# Patient Record
Sex: Male | Born: 1960
Health system: Southern US, Community
[De-identification: ages and names within clinical notes are randomized; demographics above are authoritative.]

## PROBLEM LIST (undated history)

## (undated) DIAGNOSIS — M545 Low back pain, unspecified: Secondary | ICD-10-CM

## (undated) DIAGNOSIS — C44310 Basal cell carcinoma of skin of unspecified parts of face: Secondary | ICD-10-CM

## (undated) DIAGNOSIS — M5090 Cervical disc disorder, unspecified, unspecified cervical region: Secondary | ICD-10-CM

## (undated) DIAGNOSIS — R51 Headache: Secondary | ICD-10-CM

## (undated) DIAGNOSIS — F419 Anxiety disorder, unspecified: Secondary | ICD-10-CM

## (undated) DIAGNOSIS — Z9989 Dependence on other enabling machines and devices: Secondary | ICD-10-CM

## (undated) DIAGNOSIS — I493 Ventricular premature depolarization: Secondary | ICD-10-CM

## (undated) DIAGNOSIS — G43909 Migraine, unspecified, not intractable, without status migrainosus: Secondary | ICD-10-CM

## (undated) DIAGNOSIS — G4733 Obstructive sleep apnea (adult) (pediatric): Secondary | ICD-10-CM

## (undated) DIAGNOSIS — R519 Headache, unspecified: Secondary | ICD-10-CM

## (undated) DIAGNOSIS — T7840XA Allergy, unspecified, initial encounter: Secondary | ICD-10-CM

## (undated) DIAGNOSIS — I10 Essential (primary) hypertension: Secondary | ICD-10-CM

## (undated) HISTORY — PX: LAPAROSCOPIC CHOLECYSTECTOMY: SUR755

## (undated) HISTORY — PX: RHINOPLASTY: SUR1284

## (undated) HISTORY — DX: Essential (primary) hypertension: I10

## (undated) HISTORY — DX: Headache: R51

## (undated) HISTORY — PX: BASAL CELL CARCINOMA EXCISION: SHX1214

## (undated) HISTORY — DX: Headache, unspecified: R51.9

## (undated) HISTORY — DX: Ventricular premature depolarization: I49.3

## (undated) HISTORY — DX: Anxiety disorder, unspecified: F41.9

## (undated) HISTORY — DX: Low back pain, unspecified: M54.50

## (undated) HISTORY — PX: TONSILLECTOMY: SUR1361

## (undated) HISTORY — PX: VENTRICULAR ABLATION SURGERY: SHX835

## (undated) HISTORY — PX: COLONOSCOPY: SHX174

## (undated) HISTORY — DX: Allergy, unspecified, initial encounter: T78.40XA

---

## 1998-05-19 HISTORY — PX: UVULOPALATOPHARYNGOPLASTY: SHX827

## 1998-05-19 HISTORY — PX: SEPTOPLASTY: SUR1290

## 2014-06-08 ENCOUNTER — Encounter: Payer: Self-pay | Admitting: Internal Medicine

## 2014-08-09 ENCOUNTER — Ambulatory Visit (AMBULATORY_SURGERY_CENTER): Payer: Self-pay

## 2014-08-09 VITALS — Ht 72.0 in | Wt 206.8 lb

## 2014-08-09 DIAGNOSIS — Z800 Family history of malignant neoplasm of digestive organs: Secondary | ICD-10-CM

## 2014-08-09 MED ORDER — MOVIPREP 100 G PO SOLR: 1.0000 | Freq: Once | ORAL | Status: DC

## 2014-08-09 NOTE — Progress Notes (Signed)
No allergies to eggs or soy No diet/weight loss meds No home oxygen No past problems with anesthesia  Has email  Emmi instructions given for colonoscopy 

## 2014-08-10 ENCOUNTER — Telehealth: Payer: Self-pay | Admitting: Internal Medicine

## 2014-08-22 ENCOUNTER — Telehealth: Payer: Self-pay | Admitting: Internal Medicine

## 2014-08-22 NOTE — Telephone Encounter (Signed)
See note dated 08/22/2014

## 2014-08-22 NOTE — Telephone Encounter (Signed)
Spoke with patient and told him I would switch his prep from Moviprep to [employer] so that I could give him a free sample.  Patient's wife is going to call me to set up a time to come get the new instructions and the prep

## 2014-08-23 ENCOUNTER — Telehealth: Payer: Self-pay

## 2014-08-23 NOTE — Telephone Encounter (Signed)
Printed new suprep instructions.  Patient's wife will call to come get new instructions and Suprep sample

## 2014-08-30 ENCOUNTER — Encounter: Payer: Self-pay | Admitting: Internal Medicine

## 2014-08-30 ENCOUNTER — Ambulatory Visit (AMBULATORY_SURGERY_CENTER): Payer: 59 | Admitting: Internal Medicine

## 2014-08-30 VITALS — BP 106/65 | HR 73 | Resp 30

## 2014-08-30 DIAGNOSIS — Z1211 Encounter for screening for malignant neoplasm of colon: Secondary | ICD-10-CM

## 2014-08-30 DIAGNOSIS — D123 Benign neoplasm of transverse colon: Secondary | ICD-10-CM | POA: Diagnosis not present

## 2014-08-30 DIAGNOSIS — Z800 Family history of malignant neoplasm of digestive organs: Secondary | ICD-10-CM | POA: Diagnosis not present

## 2014-08-30 MED ORDER — SODIUM CHLORIDE 0.9 % IV SOLN: 500.0000 mL | INTRAVENOUS | Status: DC

## 2014-08-30 NOTE — Patient Instructions (Signed)
YOU HAD AN ENDOSCOPIC PROCEDURE TODAY AT [facility]:   Refer to the procedure report that was given to you for any specific questions about what was found during the examination.  If the procedure report does not answer your questions, please call your gastroenterologist to clarify.  If you requested that your care partner not be given the details of your procedure findings, then the procedure report has been included in a sealed envelope for you to review at your convenience later.  YOU SHOULD EXPECT: Some feelings of bloating in the abdomen. Passage of more gas than usual.  Walking can help get rid of the air that was put into your GI tract during the procedure and reduce the bloating. If you had a lower endoscopy (such as a colonoscopy or flexible sigmoidoscopy) you may notice spotting of blood in your stool or on the toilet paper. If you underwent a bowel prep for your procedure, you may not have a normal bowel movement for a few days.  Please Note:  You might notice some irritation and congestion in your nose or some drainage.  This is from the oxygen used during your procedure.  There is no need for concern and it should clear up in a day or so.  SYMPTOMS TO REPORT IMMEDIATELY:   Following lower endoscopy (colonoscopy or flexible sigmoidoscopy):  Excessive amounts of blood in the stool  Significant tenderness or worsening of abdominal pains  Swelling of the abdomen that is new, acute  Fever of 100F or higher   Following upper endoscopy (EGD)  Vomiting of blood or coffee ground material  New chest pain or pain under the shoulder blades  Painful or persistently difficult swallowing  New shortness of breath  Fever of 100F or higher  Black, tarry-looking stools  For urgent or emergent issues, a gastroenterologist can be reached at any hour by calling [phone].   DIET: Your first meal following the procedure should be a small meal and then it is ok to progress to  your normal diet. Heavy or fried foods are harder to digest and may make you feel nauseous or bloated.  Likewise, meals heavy in dairy and vegetables can increase bloating.  Drink plenty of fluids but you should avoid alcoholic beverages for 24 hours.  ACTIVITY:  You should plan to take it easy for the rest of today and you should NOT DRIVE or use heavy machinery until tomorrow (because of the sedation medicines used during the test).    FOLLOW UP: Our staff will call the number listed on your records the next business day following your procedure to check on you and address any questions or concerns that you may have regarding the information given to you following your procedure. If we do not reach you, we will leave a message.  However, if you are feeling well and you are not experiencing any problems, there is no need to return our call.  We will assume that you have returned to your regular daily activities without incident.  If any biopsies were taken you will be contacted by phone or by letter within the next 1-3 weeks.  Please call us at [phone] if you have not heard about the biopsies in 3 weeks.  Polyp information given.    SIGNATURES/CONFIDENTIALITY: You and/or your care partner have signed paperwork which will be entered into your electronic medical record.  These signatures attest to the fact that that the information above on your After Visit  Summary has been reviewed and is understood.  Full responsibility of the confidentiality of this discharge information lies with you and/or your care-partner.

## 2014-08-30 NOTE — Progress Notes (Signed)
Called to room to assist during endoscopic procedure.  Patient ID and intended procedure confirmed with present staff. Received instructions for my participation in the procedure from the performing physician.  

## 2014-08-30 NOTE — Op Note (Signed)
[place]  [address] [place], [address]   COLONOSCOPY PROCEDURE REPORT  PATIENT: Arthur Robinson, Arthur Robinson  MR#: [id number] BIRTHDATE: 05/04/1961 , 71  yrs. old GENDER: male ENDOSCOPIST: Eustace Quail, MD REFERRED BY:.Direct Self PROCEDURE DATE:  08/30/2014 PROCEDURE:   Colonoscopy, screening and Colonoscopy with snare polypectomy x 1 First Screening Colonoscopy - Avg.  risk and is 50 yrs.  old or older Yes.  Prior Negative Screening - Now for repeat screening. N/A  History of Adenoma - Now for follow-up colonoscopy & has been > or = to 3 yrs.  N/A ASA CLASS:   Class I INDICATIONS:Screening for colonic neoplasia and Colorectal Neoplasm Risk Assessment for this procedure is average risk.   Reports colonoscopy in [place] 15 yrs ago was unremarkable MEDICATIONS: Propofol 300 mg IV  DESCRIPTION OF PROCEDURE:   After the risks benefits and alternatives of the procedure were thoroughly explained, informed consent was obtained.  The digital rectal exam revealed no abnormalities of the rectum.   The LB [id number]  endoscope was introduced through the anus and advanced to the cecum, which was identified by both the appendix and ileocecal valve. No adverse events experienced.   The quality of the prep was excellent. (MoviPrep was used)  The instrument was then slowly withdrawn as the colon was fully examined.    COLON FINDINGS: Two polyps measuring 3 mm in size were found in the transverse colon.  A polypectomy was performed with a cold snare. The resection was complete, the polyp tissue was completely retrieved and sent to histology.   The examination was otherwise normal.  Retroflexed views revealed internal hemorrhoids. The time to cecum = 2.9 Withdrawal time = 13.1   The scope was withdrawn and the procedure completed.  COMPLICATIONS: There were no immediate complications.  ENDOSCOPIC IMPRESSION: 1.   Two polyps were found in the transverse colon;  polypectomy was performed with a cold snare 2.   The examination was otherwise normal  RECOMMENDATIONS: 1. Repeat colonoscopy in 5 years if polyp adenomatous; otherwise 10 years  eSigned:  Eustace Quail, MD 08/30/2014 11:03 AM   cc: The Patient

## 2014-08-30 NOTE — Progress Notes (Signed)
Patient awakening,vss,report to rn 

## 2014-08-31 ENCOUNTER — Telehealth: Payer: Self-pay | Admitting: *Deleted

## 2014-08-31 NOTE — Telephone Encounter (Signed)
  Follow up Call-  Call back number 08/30/2014  Post procedure Call Back phone  # [phone]  Permission to leave phone message Yes     No answer at # given.  Left message on VM.

## 2014-09-05 ENCOUNTER — Encounter: Payer: Self-pay | Admitting: Internal Medicine

## 2014-10-06 ENCOUNTER — Encounter: Payer: Self-pay | Admitting: Internal Medicine

## 2014-10-06 NOTE — Progress Notes (Signed)
We requested all GI records from Dr Francine Graven [facility]--- Fax [phone]) on patient. We have received correspondence from Mallie Mussel at [facility] that there are no medical records for the patient at their facility.

## 2014-11-17 ENCOUNTER — Encounter: Payer: Self-pay | Admitting: Internal Medicine

## 2015-03-23 ENCOUNTER — Encounter: Payer: Self-pay | Admitting: Internal Medicine

## 2015-04-25 ENCOUNTER — Encounter: Payer: Self-pay | Admitting: Internal Medicine

## 2015-05-20 HISTORY — PX: INGUINAL HERNIA REPAIR: SUR1180

## 2015-06-18 DIAGNOSIS — I493 Ventricular premature depolarization: Secondary | ICD-10-CM | POA: Diagnosis not present

## 2015-07-11 DIAGNOSIS — G4733 Obstructive sleep apnea (adult) (pediatric): Secondary | ICD-10-CM | POA: Diagnosis not present

## 2015-07-18 ENCOUNTER — Other Ambulatory Visit: Payer: Self-pay

## 2015-07-18 NOTE — Patient Outreach (Signed)
12:10pm  High Risk Screening:  Placed call to patient who reports he is at work and can not talk.  Asked for a call back later.  PLAN: Will attempt outreach again.  Tomasa Rand, RN, BSN, CEN [facility] [employer] [phone]

## 2015-07-26 ENCOUNTER — Other Ambulatory Visit: Payer: Self-pay

## 2015-07-26 NOTE — Patient Outreach (Signed)
UMR High risk screening: 2nd call attempt to reach patient. No answer. Left a message requesting a call back.  PLAN: Will attempt another outreach in 1 week. If no response, will mail letter.   Tomasa Rand, RN, BSN, CEN [facility] [employer] [phone]

## 2015-07-30 ENCOUNTER — Other Ambulatory Visit: Payer: Self-pay

## 2015-07-30 NOTE — Patient Outreach (Signed)
UMR high risk screening: Received call back from patient.  Patient reports that he is doing well. Reports that he had to have a ablation and it was unsuccessful. Reports that he will likely need another ablation in the next few months.    Patient reports that he is able to get his medications at a local pharmacy and it is cheaper than mail order. Pays out of pocket for this medication:  Atenolol.  Patient reports his only concern is out being out of network for any procedure not done at a [place] facility. Patient reports that he is already established with and Electrophysiologist in [place].  He is interested in an exception for his health insurance to cover procedure and upcoming procedures. Consent: Patient consent for [facility] to speak with wife Melville Lionetti PLAN: Reminded patient of benefit of [place].            Will forward request to assistant director Bary Castilla for assistance with insurance exception.             Otherwise denied any other needs or concerns.  Tomasa Rand, RN, BSN, CEN [facility] [employer] [phone]

## 2015-07-30 NOTE — Patient Outreach (Signed)
High risk UMR screening:  3rd attempt to reach patient for High Cost UMR screening.  No answer.  Plan: Will mail letter to patient regarding outreach attempt.  Tomasa Rand, RN, BSN, CEN [facility] [employer] [phone]

## 2015-09-03 ENCOUNTER — Institutional Professional Consult (permissible substitution): Payer: 59 | Admitting: Internal Medicine

## 2015-09-06 DIAGNOSIS — I10 Essential (primary) hypertension: Secondary | ICD-10-CM | POA: Diagnosis not present

## 2015-09-06 DIAGNOSIS — I493 Ventricular premature depolarization: Secondary | ICD-10-CM | POA: Diagnosis not present

## 2015-09-06 DIAGNOSIS — Z79899 Other long term (current) drug therapy: Secondary | ICD-10-CM | POA: Diagnosis not present

## 2015-10-03 DIAGNOSIS — F419 Anxiety disorder, unspecified: Secondary | ICD-10-CM | POA: Insufficient documentation

## 2015-10-03 DIAGNOSIS — Z8379 Family history of other diseases of the digestive system: Secondary | ICD-10-CM | POA: Insufficient documentation

## 2015-10-03 DIAGNOSIS — G4733 Obstructive sleep apnea (adult) (pediatric): Secondary | ICD-10-CM | POA: Insufficient documentation

## 2015-10-03 DIAGNOSIS — I493 Ventricular premature depolarization: Secondary | ICD-10-CM | POA: Insufficient documentation

## 2015-10-08 ENCOUNTER — Other Ambulatory Visit: Payer: Self-pay

## 2015-10-08 ENCOUNTER — Ambulatory Visit (INDEPENDENT_AMBULATORY_CARE_PROVIDER_SITE_OTHER): Payer: 59 | Admitting: Internal Medicine

## 2015-10-08 ENCOUNTER — Encounter: Payer: Self-pay | Admitting: Internal Medicine

## 2015-10-08 VITALS — BP 142/80 | HR 79 | Ht 72.0 in | Wt 207.6 lb

## 2015-10-08 DIAGNOSIS — I493 Ventricular premature depolarization: Secondary | ICD-10-CM | POA: Diagnosis not present

## 2015-10-08 DIAGNOSIS — G4733 Obstructive sleep apnea (adult) (pediatric): Secondary | ICD-10-CM

## 2015-10-08 LAB — BASIC METABOLIC PANEL
BUN: 10 mg/dL (ref 7–25)
CHLORIDE: 104 mmol/L (ref 98–110)
CO2: 29 mmol/L (ref 20–31)
CREATININE: 0.79 mg/dL (ref 0.70–1.33)
Calcium: 9.1 mg/dL (ref 8.6–10.3)
Glucose, Bld: 85 mg/dL (ref 65–99)
Potassium: 4.2 mmol/L (ref 3.5–5.3)
Sodium: 141 mmol/L (ref 135–146)

## 2015-10-08 LAB — CBC WITH DIFFERENTIAL/PLATELET
BASOS ABS: 0 {cells}/uL (ref 0–200)
Basophils Relative: 0 %
EOS ABS: 392 {cells}/uL (ref 15–500)
Eosinophils Relative: 7 %
HEMATOCRIT: 43.7 % (ref 38.5–50.0)
HEMOGLOBIN: 14.8 g/dL (ref 13.2–17.1)
LYMPHS ABS: 1400 {cells}/uL (ref 850–3900)
Lymphocytes Relative: 25 %
MCH: 30.6 pg (ref 27.0–33.0)
MCHC: 33.9 g/dL (ref 32.0–36.0)
MCV: 90.5 fL (ref 80.0–100.0)
MONO ABS: 504 {cells}/uL (ref 200–950)
MONOS PCT: 9 %
MPV: 10.1 fL (ref 7.5–12.5)
NEUTROS ABS: 3304 {cells}/uL (ref 1500–7800)
Neutrophils Relative %: 59 %
PLATELETS: 232 10*3/uL (ref 140–400)
RBC: 4.83 MIL/uL (ref 4.20–5.80)
RDW: 13.5 % (ref 11.0–15.0)
WBC: 5.6 10*3/uL (ref 3.8–10.8)

## 2015-10-08 NOTE — Progress Notes (Signed)
Electrophysiology Office Note   Date:  10/10/2015   ID:  Arthur Robinson, DOB [birth year], MRN [id number]  PCP:  Deucher, Herbie [place] ([place])  Cardiologist:  none Primary Electrophysiologist: Aggie Cosier MD  CC: PVCs   History of Present Illness: Arthur Robinson is a 55 y.o. male who presents today for electrophysiology evaluation.   The patient reports initially having PVCs of abrupt onset 4/16.  He has had difficulty with PVCs since that time.  He reports symptoms of palpitations, neck fullness, and decreased exercise tolerance with his PVCs. He continues to play tennis and finds that PVCs are worse with exercise.  He was evaluated by Dr Uvaldo Rising.  He tried metoprolol without improvement.  He underwent PVC ablation 03/27/15.  This was unsuccessful with extensive mapping and ablation on the RVOT and LVOT sites.  He returned for second ablation 09/06/15.  He reports that he did well initiation post ablation but has since had return of his PVCs.  He presents today for second opinion.  He previously tried atenolol and had postural dizziness/ presyncope.  Today, he denies symptoms of chest pain, shortness of breath, orthopnea, PND, lower extremity edema, claudication, dizziness, presyncope, syncope, bleeding, or neurologic sequela. The patient is tolerating medications without difficulties and is otherwise without complaint today.    Past Medical History  Diagnosis Date  . Headache   . Hypertension     has not required medicine (typically 130/90)  . Obstructive sleep apnea     uses CPAP  . Anxiety   . PVC (premature ventricular contraction)     s/p ablation x 2 in [place]   Past Surgical History  Procedure Laterality Date  . Cholecystectomy    . Rhinoplasty    . Uvulopalatopharyngoplasty       Current Outpatient Prescriptions  Medication Sig Dispense Refill  . ibuprofen (ADVIL,MOTRIN) 200 MG tablet Take 200 mg by mouth every 6 (six) hours as needed. Takes 200-400 1-4 times per  month headache     No current facility-administered medications for this visit.    Allergies:   Review of patient's allergies indicates no known allergies.   Social History:  The patient  reports that he has never smoked. He has never used smokeless tobacco. He reports that he drinks about 2.4 oz of alcohol per week. He reports that he does not use illicit drugs.   Family History:  The patient's  family history includes Atrial fibrillation in his sister; Colon cancer in his maternal grandmother; Crohn's disease in his brother; Heart disease in his father and maternal grandfather; Hypertension in his father and mother; Lung cancer in his mother; Sudden death in his maternal grandfather.    ROS:  Please see the history of present illness.   All other systems are reviewed and negative.    PHYSICAL EXAM: VS:  BP 142/80 mmHg  Pulse 79  Ht 6' (1.829 m)  Wt 207 lb 9.6 oz (94.167 kg)  BMI 28.15 kg/m2 , BMI Body mass index is 28.15 kg/(m^2). GEN: Well nourished, well developed, in no acute distress HEENT: normal Neck: no JVD, carotid bruits, or masses Cardiac: RRR with frequent ectopy; no murmurs, rubs, or gallops,no edema  Respiratory:  clear to auscultation bilaterally, normal work of breathing GI: soft, nontender, nondistended, + BS MS: no deformity or atrophy Skin: warm and dry  Neuro:  Strength and sensation are intact Psych: euthymic mood, full affect  EKG:  EKG is ordered today. The ekg ordered today shows sinus rhythm 79  bpm, PVCs (LBB inferior axis with transition V2-V3)   Wt Readings from Last 3 Encounters:  10/08/15 207 lb 9.6 oz (94.167 kg)  08/09/14 206 lb 12.8 oz (93.804 kg)      Other studies Reviewed: Additional studies/ records that were reviewed today include: 2 prior ep studies, event monitor and notes from [place]  Review of the above records today demonstrates: as above   ASSESSMENT AND PLAN:  1.  PVCs The patient has symptomatic outflow tract PVCs  despite abaltion x 2. Therapeutic strategies for PVCs including medicine and ablation were discussed in detail with the patient today. Risk, benefits, and alternatives to EP study and radiofrequency ablation were also discussed in detail today. These risks include but are not limited to stroke, bleeding, vascular damage, tamponade, perforation, damage to the heart and other structures, AV block requiring pacemaker, worsening renal function, and death. The patient understands these risk and wishes to proceed.  We will therefore proceed with catheter ablation at the next available time.  2. HTN Stable No change required today  3. OSA Stable No change required today  Current medicines are reviewed at length with the patient today.   The patient does not have concerns regarding his medicines.  The following changes were made today:  none  Labs/ tests ordered today include:  Orders Placed This Encounter  Procedures  . Basic metabolic panel  . CBC with Differential  . EKG 12-Lead     Signed, Thompson Grayer, MD  10/10/2015 8:58 AM     [facility] [address] [place] [address] [phone] (office) [phone] (fax)

## 2015-10-08 NOTE — Patient Instructions (Signed)
Medication Instructions:  Your physician recommends that you continue on your current medications as directed. Please refer to the Current Medication list given to you today.   Labwork: Your physician recommends that you return for lab work today: BMP/CBC   Testing/Procedures: Your physician has recommended that you have an ablation. Catheter ablation is a medical procedure used to treat some cardiac arrhythmias (irregular heartbeats). During catheter ablation, a long, thin, flexible tube is put into a blood vessel in your groin (upper thigh), or neck. This tube is called an ablation catheter. It is then guided to your heart through the blood vessel. Radio frequency waves destroy small areas of heart tissue where abnormal heartbeats may cause an arrhythmia to start. Please see the instruction sheet given to you today.  Please check in at the [facility] at 8:30am on 10/16/15 Do not eat or drink after midnight the night before your procedure Do not take any medications the morning of your procedure  Follow-Up: Your physician recommends that you schedule a follow-up appointment in: 4 weeks from 10/16/15 with Dr Rayann Heman    Any Other Special Instructions Will Be Listed Below (If Applicable).     If you need a refill on your cardiac medications before your next appointment, please call your pharmacy.

## 2015-10-16 ENCOUNTER — Ambulatory Visit (HOSPITAL_COMMUNITY): Payer: 59 | Admitting: Anesthesiology

## 2015-10-16 ENCOUNTER — Encounter (HOSPITAL_COMMUNITY)
Admission: RE | Disposition: A | Discharge status: Home / Self Care | Payer: Self-pay | Source: Ambulatory Visit | Attending: Internal Medicine

## 2015-10-16 ENCOUNTER — Ambulatory Visit (HOSPITAL_COMMUNITY)
Admission: RE | Admit: 2015-10-16 | Discharge: 2015-10-16 | Disposition: A | Discharge status: Home / Self Care | Payer: 59 | Source: Ambulatory Visit | Attending: Internal Medicine | Admitting: Internal Medicine

## 2015-10-16 ENCOUNTER — Encounter (HOSPITAL_COMMUNITY): Payer: Self-pay | Admitting: Anesthesiology

## 2015-10-16 DIAGNOSIS — Z8249 Family history of ischemic heart disease and other diseases of the circulatory system: Secondary | ICD-10-CM | POA: Insufficient documentation

## 2015-10-16 DIAGNOSIS — I472 Ventricular tachycardia: Secondary | ICD-10-CM | POA: Diagnosis not present

## 2015-10-16 DIAGNOSIS — I493 Ventricular premature depolarization: Secondary | ICD-10-CM | POA: Diagnosis not present

## 2015-10-16 DIAGNOSIS — G4733 Obstructive sleep apnea (adult) (pediatric): Secondary | ICD-10-CM | POA: Insufficient documentation

## 2015-10-16 DIAGNOSIS — I10 Essential (primary) hypertension: Secondary | ICD-10-CM | POA: Diagnosis not present

## 2015-10-16 HISTORY — PX: ELECTROPHYSIOLOGIC STUDY: SHX172A

## 2015-10-16 HISTORY — DX: Basal cell carcinoma of skin of unspecified parts of face: C44.310

## 2015-10-16 HISTORY — DX: Migraine, unspecified, not intractable, without status migrainosus: G43.909

## 2015-10-16 HISTORY — DX: Dependence on other enabling machines and devices: Z99.89

## 2015-10-16 HISTORY — DX: Cervical disc disorder, unspecified, unspecified cervical region: M50.90

## 2015-10-16 HISTORY — DX: Obstructive sleep apnea (adult) (pediatric): G47.33

## 2015-10-16 SURGERY — A-FLUTTER/A-TACH/SVT ABLATION
Anesthesia: Monitor Anesthesia Care

## 2015-10-16 MED ORDER — MIDAZOLAM HCL 5 MG/5ML IJ SOLN
INTRAMUSCULAR | Status: DC | PRN
  Administered 2015-10-16: 2 mg via INTRAVENOUS

## 2015-10-16 MED ORDER — ONDANSETRON HCL 4 MG/2ML IJ SOLN: 4.0000 mg | Freq: Four times a day (QID) | INTRAMUSCULAR | Status: DC | PRN

## 2015-10-16 MED ORDER — ACETAMINOPHEN 325 MG PO TABS: 650.0000 mg | ORAL_TABLET | ORAL | Status: DC | PRN

## 2015-10-16 MED ORDER — OXYCODONE HCL 5 MG PO TABS: 5.0000 mg | ORAL_TABLET | Freq: Once | ORAL | Status: DC | PRN

## 2015-10-16 MED ORDER — SODIUM CHLORIDE 0.9 % IV SOLN
2.0000 ug/min | INTRAVENOUS | Status: AC
Start: 2015-10-16 — End: 2015-10-16
  Administered 2015-10-16: 2 ug/min via INTRAVENOUS
  Filled 2015-10-16: qty 2

## 2015-10-16 MED ORDER — SODIUM CHLORIDE 0.9% FLUSH: 3.0000 mL | INTRAVENOUS | Status: DC | PRN

## 2015-10-16 MED ORDER — BUPIVACAINE HCL (PF) 0.25 % IJ SOLN
INTRAMUSCULAR | Status: DC | PRN
  Administered 2015-10-16: 30 mL

## 2015-10-16 MED ORDER — BUPIVACAINE HCL (PF) 0.25 % IJ SOLN
INTRAMUSCULAR | Status: AC
  Filled 2015-10-16: qty 30

## 2015-10-16 MED ORDER — PROPOFOL 500 MG/50ML IV EMUL
INTRAVENOUS | Status: DC | PRN
  Administered 2015-10-16: 25 ug/kg/min via INTRAVENOUS

## 2015-10-16 MED ORDER — OXYCODONE HCL 5 MG/5ML PO SOLN: 5.0000 mg | Freq: Once | ORAL | Status: DC | PRN

## 2015-10-16 MED ORDER — SODIUM CHLORIDE 0.9 % IV SOLN
INTRAVENOUS | Status: DC
  Administered 2015-10-16 (×2): via INTRAVENOUS

## 2015-10-16 MED ORDER — HYDROCODONE-ACETAMINOPHEN 5-325 MG PO TABS: 1.0000 | ORAL_TABLET | ORAL | Status: DC | PRN

## 2015-10-16 MED ORDER — ONDANSETRON HCL 4 MG/2ML IJ SOLN: 4.0000 mg | Freq: Once | INTRAMUSCULAR | Status: DC | PRN

## 2015-10-16 MED ORDER — SODIUM CHLORIDE 0.9 % IV SOLN: 250.0000 mL | INTRAVENOUS | Status: DC | PRN

## 2015-10-16 MED ORDER — SODIUM CHLORIDE 0.9% FLUSH
3.0000 mL | Freq: Two times a day (BID) | INTRAVENOUS | Status: DC
  Administered 2015-10-16: 3 mL via INTRAVENOUS

## 2015-10-16 MED ORDER — FENTANYL CITRATE (PF) 100 MCG/2ML IJ SOLN: 25.0000 ug | INTRAMUSCULAR | Status: DC | PRN

## 2015-10-16 SURGICAL SUPPLY — 11 items
BAG SNAP BAND KOVER 36X36 (MISCELLANEOUS) ×3 IMPLANT
CATH DECANAV D CURVE (CATHETERS) ×3 IMPLANT
CATH EZ STEER NAV 4MM D-F CUR (ABLATOR) ×3 IMPLANT
CATH JOSEPHSON QUAD-ALLRED 6FR (CATHETERS) ×3 IMPLANT
PACK EP LATEX FREE (CUSTOM PROCEDURE TRAY) ×2
PACK EP LF (CUSTOM PROCEDURE TRAY) ×1 IMPLANT
PAD DEFIB LIFELINK (PAD) ×3 IMPLANT
PATCH CARTO3 (PAD) ×3 IMPLANT
SHEATH PINNACLE 6F 10CM (SHEATH) ×3 IMPLANT
SHEATH PINNACLE 7F 10CM (SHEATH) ×3 IMPLANT
SHEATH PINNACLE 8F 10CM (SHEATH) ×3 IMPLANT

## 2015-10-16 NOTE — Progress Notes (Signed)
Site area: Right groin a 6, 7,8 french venous sheath was removed  Site Prior to Removal:  Level 0  Pressure Applied For 30 MINUTES    Bedrest Beginning at 1455p  Manual:   Yes.    Patient Status During Pull:  stable  Post Pull Groin Site:  Level 0  Post Pull Instructions Given:  Yes.    Post Pull Pulses Present:  Yes.    Dressing Applied:  Yes.    Comments:  VS remain stable during sheath pull.

## 2015-10-16 NOTE — Progress Notes (Signed)
Discharge home,discharged instruction given to the patient. Danielle Lento RN

## 2015-10-16 NOTE — Progress Notes (Signed)
Doing well post ablation Will plan to discharge home once bedrest up Routine follow up and instructions  Chanetta Marshall, NP 10/16/2015 3:49 PM  Trude Mcburney

## 2015-10-16 NOTE — Discharge Instructions (Signed)
No driving for 3 days. No lifting over 5 lbs for 1 week. No sexual activity for 1 week. You may return to work in 1 week.  Keep procedure site clean & dry. If you notice increased pain, swelling, bleeding or pus, call/return!  You may shower, but no soaking baths/hot tubs/pools for 1 week.  ° ° °

## 2015-10-16 NOTE — Transfer of Care (Signed)
Immediate Anesthesia Transfer of Care Note  Patient: Arthur Robinson  Procedure(s) Performed: Procedure(s): PVC Ablation (N/A)  Patient Location: Cath Lab  Anesthesia Type:MAC  Level of Consciousness: awake, alert , oriented and patient cooperative  Airway & Oxygen Therapy: Patient Spontanous Breathing  Post-op Assessment: Report given to RN and Post -op Vital signs reviewed and stable  Post vital signs: Reviewed and stable  Last Vitals:  Filed Vitals:   10/16/15 0820  BP: 157/75  Pulse: 44  Temp: 36.5 C  Resp: 16    Last Pain:  Filed Vitals:   10/16/15 0845  PainSc: 2          Complications: No apparent anesthesia complications

## 2015-10-16 NOTE — Anesthesia Postprocedure Evaluation (Signed)
Anesthesia Post Note  Patient: Arthur Robinson  Procedure(s) Performed: Procedure(s) (LRB): PVC Ablation (N/A)  Patient location during evaluation: Cath Lab Anesthesia Type: General Level of consciousness: awake, awake and alert and oriented Pain management: pain level controlled Vital Signs Assessment: post-procedure vital signs reviewed and stable Respiratory status: spontaneous breathing, nonlabored ventilation and respiratory function stable Cardiovascular status: blood pressure returned to baseline Anesthetic complications: no    Last Vitals:  Filed Vitals:   10/16/15 1450 10/16/15 1455  BP: 140/87 132/84  Pulse: 72 78  Temp:    Resp: 14 12    Last Pain:  Filed Vitals:   10/16/15 1459  PainSc: 2                  Lilas Diefendorf COKER

## 2015-10-16 NOTE — H&P (View-Only) (Signed)
Electrophysiology Office Note   Date:  10/10/2015   ID:  Arthur Robinson, DOB 12-Feb-1961, MRN [id number]  PCP:  Deucher, Herbie [place] ([place])  Cardiologist:  none Primary Electrophysiologist: Aggie Cosier MD  CC: PVCs   History of Present Illness: Arthur Robinson is a 55 y.o. male who presents today for electrophysiology evaluation.   The patient reports initially having PVCs of abrupt onset 4/16.  He has had difficulty with PVCs since that time.  He reports symptoms of palpitations, neck fullness, and decreased exercise tolerance with his PVCs. He continues to play tennis and finds that PVCs are worse with exercise.  He was evaluated by Dr Uvaldo Rising.  He tried metoprolol without improvement.  He underwent PVC ablation 03/27/15.  This was unsuccessful with extensive mapping and ablation on the RVOT and LVOT sites.  He returned for second ablation 09/06/15.  He reports that he did well initiation post ablation but has since had return of his PVCs.  He presents today for second opinion.  He previously tried atenolol and had postural dizziness/ presyncope.  Today, he denies symptoms of chest pain, shortness of breath, orthopnea, PND, lower extremity edema, claudication, dizziness, presyncope, syncope, bleeding, or neurologic sequela. The patient is tolerating medications without difficulties and is otherwise without complaint today.    Past Medical History  Diagnosis Date  . Headache   . Hypertension     has not required medicine (typically 130/90)  . Obstructive sleep apnea     uses CPAP  . Anxiety   . PVC (premature ventricular contraction)     s/p ablation x 2 in [place]   Past Surgical History  Procedure Laterality Date  . Cholecystectomy    . Rhinoplasty    . Uvulopalatopharyngoplasty       Current Outpatient Prescriptions  Medication Sig Dispense Refill  . ibuprofen (ADVIL,MOTRIN) 200 MG tablet Take 200 mg by mouth every 6 (six) hours as needed. Takes 200-400 1-4 times per  month headache     No current facility-administered medications for this visit.    Allergies:   Review of patient's allergies indicates no known allergies.   Social History:  The patient  reports that he has never smoked. He has never used smokeless tobacco. He reports that he drinks about 2.4 oz of alcohol per week. He reports that he does not use illicit drugs.   Family History:  The patient's  family history includes Atrial fibrillation in his sister; Colon cancer in his maternal grandmother; Crohn's disease in his brother; Heart disease in his father and maternal grandfather; Hypertension in his father and mother; Lung cancer in his mother; Sudden death in his maternal grandfather.    ROS:  Please see the history of present illness.   All other systems are reviewed and negative.    PHYSICAL EXAM: VS:  BP 142/80 mmHg  Pulse 79  Ht 6' (1.829 m)  Wt 207 lb 9.6 oz (94.167 kg)  BMI 28.15 kg/m2 , BMI Body mass index is 28.15 kg/(m^2). GEN: Well nourished, well developed, in no acute distress HEENT: normal Neck: no JVD, carotid bruits, or masses Cardiac: RRR with frequent ectopy; no murmurs, rubs, or gallops,no edema  Respiratory:  clear to auscultation bilaterally, normal work of breathing GI: soft, nontender, nondistended, + BS MS: no deformity or atrophy Skin: warm and dry  Neuro:  Strength and sensation are intact Psych: euthymic mood, full affect  EKG:  EKG is ordered today. The ekg ordered today shows sinus rhythm 79  bpm, PVCs (LBB inferior axis with transition V2-V3)   Wt Readings from Last 3 Encounters:  10/08/15 207 lb 9.6 oz (94.167 kg)  08/09/14 206 lb 12.8 oz (93.804 kg)      Other studies Reviewed: Additional studies/ records that were reviewed today include: 2 prior ep studies, event monitor and notes from [place]  Review of the above records today demonstrates: as above   ASSESSMENT AND PLAN:  1.  PVCs The patient has symptomatic outflow tract PVCs  despite abaltion x 2. Therapeutic strategies for PVCs including medicine and ablation were discussed in detail with the patient today. Risk, benefits, and alternatives to EP study and radiofrequency ablation were also discussed in detail today. These risks include but are not limited to stroke, bleeding, vascular damage, tamponade, perforation, damage to the heart and other structures, AV block requiring pacemaker, worsening renal function, and death. The patient understands these risk and wishes to proceed.  We will therefore proceed with catheter ablation at the next available time.  2. HTN Stable No change required today  3. OSA Stable No change required today  Current medicines are reviewed at length with the patient today.   The patient does not have concerns regarding his medicines.  The following changes were made today:  none  Labs/ tests ordered today include:  Orders Placed This Encounter  Procedures  . Basic metabolic panel  . CBC with Differential  . EKG 12-Lead     Signed, Thompson Grayer, MD  10/10/2015 8:58 AM     [facility] [address] [place] [address] [phone] (office) [phone] (fax)

## 2015-10-16 NOTE — Anesthesia Preprocedure Evaluation (Addendum)
Anesthesia Evaluation  Patient identified by MRN, date of birth, ID band Patient awake    Reviewed: Allergy & Precautions, NPO status , Patient's Chart, lab work & pertinent test results  Airway Mallampati: II  TM Distance: >3 FB Neck ROM: Full    Dental  (+) Teeth Intact, Dental Advisory Given   Pulmonary    breath sounds clear to auscultation       Cardiovascular hypertension,  Rhythm:Regular Rate:Normal     Neuro/Psych    GI/Hepatic   Endo/Other    Renal/GU      Musculoskeletal   Abdominal   Peds  Hematology   Anesthesia Other Findings   Reproductive/Obstetrics                            Anesthesia Physical Anesthesia Plan  ASA: III  Anesthesia Plan: MAC   Post-op Pain Management:    Induction: Intravenous  Airway Management Planned: Natural Airway and Simple Face Mask  Additional Equipment:   Intra-op Plan:   Post-operative Plan:   Informed Consent: I have reviewed the patients History and Physical, chart, labs and discussed the procedure including the risks, benefits and alternatives for the proposed anesthesia with the patient or authorized representative who has indicated his/her understanding and acceptance.   Dental advisory given  Plan Discussed with: CRNA and Anesthesiologist  Anesthesia Plan Comments:        Anesthesia Quick Evaluation

## 2015-10-16 NOTE — Interval H&P Note (Signed)
History and Physical Interval Note:  10/16/2015 11:41 AM  Arthur Robinson  has presented today for surgery, with the diagnosis of pvc  The various methods of treatment have been discussed with the patient and family. After consideration of risks, benefits and other options for treatment, the patient has consented to  Procedure(s): PVC Ablation (N/A) as a surgical intervention .  The patient's history has been reviewed, patient examined, no change in status, stable for surgery.  I have reviewed the patient's chart and labs.  Questions were answered to the patient's satisfaction.     Thompson Grayer

## 2015-10-17 ENCOUNTER — Encounter (HOSPITAL_COMMUNITY): Payer: Self-pay | Admitting: Internal Medicine

## 2015-11-14 ENCOUNTER — Ambulatory Visit (INDEPENDENT_AMBULATORY_CARE_PROVIDER_SITE_OTHER): Payer: 59 | Admitting: Internal Medicine

## 2015-11-14 ENCOUNTER — Encounter: Payer: Self-pay | Admitting: Internal Medicine

## 2015-11-14 VITALS — BP 136/88 | HR 73 | Ht 72.0 in | Wt 210.0 lb

## 2015-11-14 DIAGNOSIS — I493 Ventricular premature depolarization: Secondary | ICD-10-CM | POA: Diagnosis not present

## 2015-11-14 NOTE — Patient Instructions (Addendum)
Medication Instructions:  Your physician recommends that you continue on your current medications as directed. Please refer to the Current Medication list given to you today.  Labwork: None ordered.  Testing/Procedures: None ordered.  Follow-Up: Your physician recommends that you schedule a follow-up appointment as needed.   Any Other Special Instructions Will Be Listed Below (If Applicable).     If you need a refill on your cardiac medications before your next appointment, please call your pharmacy.   

## 2015-11-14 NOTE — Progress Notes (Signed)
   Arthur Robinson is a 55 y.o. male who presents today for routine electrophysiology followup.  Since his recent PVC ablation, the patient reports doing very well.  He denies procedure related complications and is pleased with his results.  Very rare PVCs now.   Today, he denies symptoms of chest pain, shortness of breath,  lower extremity edema, dizziness, presyncope, or syncope.  The patient is otherwise without complaint today.   Past Medical History  Diagnosis Date  . PVC (premature ventricular contraction)     s/p ablation x 2 in [place], third ablation at [facility] by Dr Rayann Heman 09/2015  . Basal cell carcinoma, face     "several frozen off; one cut off" (10/16/2015)  . Hypertension     has not required medicine (typically 130/90)  . OSA on CPAP   . Migraine     hx  . Headache     "~ monthly; related to cervical disc disease" (10/16/2015)  . Cervical disc disease   . Anxiety     "use Xanax prn" (10/16/2015)   Past Surgical History  Procedure Laterality Date  . Rhinoplasty    . Uvulopalatopharyngoplasty  2000  . Tonsillectomy  ~ 1975  . Laparoscopic cholecystectomy  2000s  . Basal cell carcinoma excision  ~ 2010    "face"  . Ventricular ablation surgery  03/27/2015; 09/06/2015; 10/16/2015  . Septoplasty  2000    w/UPPP  . Electrophysiologic study N/A 10/16/2015    Procedure: PVC Ablation;  Surgeon: Thompson Grayer, MD;  Location: [facility];  Service: Cardiovascular;  Laterality: N/A;    ROS- all systems are reviewed and negatives except as per HPI above  Current Outpatient Prescriptions  Medication Sig Dispense Refill  . hydrocortisone cream 1 % Apply 1 application topically daily as needed (For dry skin.).    Marland Kitchen ibuprofen (ADVIL,MOTRIN) 200 MG tablet Take 200 mg by mouth every 6 (six) hours as needed for headache.     Marland Kitchen MAGNESIUM PO Take 400-500 mg by mouth daily as needed (Palpitations).     No current facility-administered medications for this visit.    Physical  Exam: Filed Vitals:   11/14/15 1555  BP: 136/88  Pulse: 73  Height: 6' (1.829 m)  Weight: 210 lb (95.255 kg)    GEN- The patient is well appearing, alert and oriented x 3 today.   Head- normocephalic, atraumatic Eyes-  Sclera clear, conjunctiva pink Ears- hearing intact Oropharynx- clear Lungs- Clear to ausculation bilaterally, normal work of breathing Heart- Regular rate and rhythm, no murmurs, rubs or gallops, PMI not laterally displaced GI- soft, NT, ND, + BS Extremities- no clubbing, cyanosis, or edema  ekg today reveals sinus rhythm 73 bpm, normal ekg  Assessment and Plan:  1. PVCs Well controlled post ablation No further workup planned  Return as needed  Thompson Grayer MD, [facility] 11/14/2015 5:39 PM

## 2015-11-15 DIAGNOSIS — D2372 Other benign neoplasm of skin of left lower limb, including hip: Secondary | ICD-10-CM | POA: Diagnosis not present

## 2015-11-15 DIAGNOSIS — L718 Other rosacea: Secondary | ICD-10-CM | POA: Diagnosis not present

## 2015-11-15 DIAGNOSIS — D1801 Hemangioma of skin and subcutaneous tissue: Secondary | ICD-10-CM | POA: Diagnosis not present

## 2015-11-15 DIAGNOSIS — Z85828 Personal history of other malignant neoplasm of skin: Secondary | ICD-10-CM | POA: Diagnosis not present

## 2015-11-15 DIAGNOSIS — L812 Freckles: Secondary | ICD-10-CM | POA: Diagnosis not present

## 2015-11-15 DIAGNOSIS — L2089 Other atopic dermatitis: Secondary | ICD-10-CM | POA: Diagnosis not present

## 2015-11-15 DIAGNOSIS — L218 Other seborrheic dermatitis: Secondary | ICD-10-CM | POA: Diagnosis not present

## 2015-11-15 DIAGNOSIS — D225 Melanocytic nevi of trunk: Secondary | ICD-10-CM | POA: Diagnosis not present

## 2015-11-15 DIAGNOSIS — L821 Other seborrheic keratosis: Secondary | ICD-10-CM | POA: Diagnosis not present

## 2015-12-12 DIAGNOSIS — I493 Ventricular premature depolarization: Secondary | ICD-10-CM | POA: Diagnosis not present

## 2015-12-14 ENCOUNTER — Telehealth: Payer: Self-pay | Admitting: Internal Medicine

## 2015-12-14 NOTE — Telephone Encounter (Signed)
Error

## 2016-05-08 DIAGNOSIS — G4733 Obstructive sleep apnea (adult) (pediatric): Secondary | ICD-10-CM | POA: Diagnosis not present

## 2016-09-22 DIAGNOSIS — I10 Essential (primary) hypertension: Secondary | ICD-10-CM | POA: Diagnosis not present

## 2016-09-22 DIAGNOSIS — J309 Allergic rhinitis, unspecified: Secondary | ICD-10-CM | POA: Diagnosis not present

## 2016-09-22 DIAGNOSIS — F418 Other specified anxiety disorders: Secondary | ICD-10-CM | POA: Diagnosis not present

## 2016-09-22 DIAGNOSIS — G4730 Sleep apnea, unspecified: Secondary | ICD-10-CM | POA: Diagnosis not present

## 2016-10-03 DIAGNOSIS — Z0000 Encounter for general adult medical examination without abnormal findings: Secondary | ICD-10-CM | POA: Diagnosis not present

## 2016-10-03 DIAGNOSIS — E785 Hyperlipidemia, unspecified: Secondary | ICD-10-CM | POA: Diagnosis not present

## 2016-11-14 DIAGNOSIS — D2261 Melanocytic nevi of right upper limb, including shoulder: Secondary | ICD-10-CM | POA: Diagnosis not present

## 2016-11-14 DIAGNOSIS — L03032 Cellulitis of left toe: Secondary | ICD-10-CM | POA: Diagnosis not present

## 2016-11-14 DIAGNOSIS — L03011 Cellulitis of right finger: Secondary | ICD-10-CM | POA: Diagnosis not present

## 2016-11-14 DIAGNOSIS — D225 Melanocytic nevi of trunk: Secondary | ICD-10-CM | POA: Diagnosis not present

## 2016-11-14 DIAGNOSIS — L718 Other rosacea: Secondary | ICD-10-CM | POA: Diagnosis not present

## 2016-11-14 DIAGNOSIS — D1801 Hemangioma of skin and subcutaneous tissue: Secondary | ICD-10-CM | POA: Diagnosis not present

## 2016-11-14 DIAGNOSIS — L308 Other specified dermatitis: Secondary | ICD-10-CM | POA: Diagnosis not present

## 2016-11-14 DIAGNOSIS — L821 Other seborrheic keratosis: Secondary | ICD-10-CM | POA: Diagnosis not present

## 2016-11-14 MED FILL — LISINOPRIL 10 MG TABLET: 10 | 30 days supply | Qty: 30 | Fill #0

## 2016-11-14 MED FILL — metroNIDAZOLE 0.75 % GEL: 0.75 | 28 days supply | Qty: 45 | Fill #0

## 2016-12-10 DIAGNOSIS — G4733 Obstructive sleep apnea (adult) (pediatric): Secondary | ICD-10-CM | POA: Diagnosis not present

## 2016-12-24 DIAGNOSIS — G4733 Obstructive sleep apnea (adult) (pediatric): Secondary | ICD-10-CM | POA: Diagnosis not present

## 2017-01-21 DIAGNOSIS — K4090 Unilateral inguinal hernia, without obstruction or gangrene, not specified as recurrent: Secondary | ICD-10-CM | POA: Diagnosis not present

## 2017-01-21 DIAGNOSIS — Z01810 Encounter for preprocedural cardiovascular examination: Secondary | ICD-10-CM | POA: Diagnosis not present

## 2017-01-21 DIAGNOSIS — R918 Other nonspecific abnormal finding of lung field: Secondary | ICD-10-CM | POA: Diagnosis not present

## 2017-02-06 DIAGNOSIS — I10 Essential (primary) hypertension: Secondary | ICD-10-CM | POA: Diagnosis not present

## 2017-02-06 DIAGNOSIS — K4090 Unilateral inguinal hernia, without obstruction or gangrene, not specified as recurrent: Secondary | ICD-10-CM | POA: Diagnosis not present

## 2017-02-06 DIAGNOSIS — Z79899 Other long term (current) drug therapy: Secondary | ICD-10-CM | POA: Diagnosis not present

## 2017-02-06 DIAGNOSIS — G4730 Sleep apnea, unspecified: Secondary | ICD-10-CM | POA: Diagnosis not present

## 2017-04-01 DIAGNOSIS — J309 Allergic rhinitis, unspecified: Secondary | ICD-10-CM | POA: Diagnosis not present

## 2017-04-01 DIAGNOSIS — I10 Essential (primary) hypertension: Secondary | ICD-10-CM | POA: Diagnosis not present

## 2017-04-01 DIAGNOSIS — E785 Hyperlipidemia, unspecified: Secondary | ICD-10-CM | POA: Diagnosis not present

## 2017-04-07 DIAGNOSIS — G4733 Obstructive sleep apnea (adult) (pediatric): Secondary | ICD-10-CM | POA: Diagnosis not present

## 2017-09-30 DIAGNOSIS — L308 Other specified dermatitis: Secondary | ICD-10-CM | POA: Diagnosis not present

## 2017-09-30 DIAGNOSIS — G4733 Obstructive sleep apnea (adult) (pediatric): Secondary | ICD-10-CM | POA: Diagnosis not present

## 2017-09-30 DIAGNOSIS — Z125 Encounter for screening for malignant neoplasm of prostate: Secondary | ICD-10-CM | POA: Diagnosis not present

## 2017-09-30 DIAGNOSIS — F418 Other specified anxiety disorders: Secondary | ICD-10-CM | POA: Diagnosis not present

## 2017-09-30 DIAGNOSIS — I10 Essential (primary) hypertension: Secondary | ICD-10-CM | POA: Diagnosis not present

## 2017-09-30 DIAGNOSIS — E785 Hyperlipidemia, unspecified: Secondary | ICD-10-CM | POA: Diagnosis not present

## 2017-09-30 DIAGNOSIS — Z0000 Encounter for general adult medical examination without abnormal findings: Secondary | ICD-10-CM | POA: Diagnosis not present

## 2017-09-30 DIAGNOSIS — J309 Allergic rhinitis, unspecified: Secondary | ICD-10-CM | POA: Diagnosis not present

## 2017-11-16 DIAGNOSIS — D2261 Melanocytic nevi of right upper limb, including shoulder: Secondary | ICD-10-CM | POA: Diagnosis not present

## 2017-11-16 DIAGNOSIS — L400 Psoriasis vulgaris: Secondary | ICD-10-CM | POA: Diagnosis not present

## 2017-11-16 DIAGNOSIS — D1801 Hemangioma of skin and subcutaneous tissue: Secondary | ICD-10-CM | POA: Diagnosis not present

## 2017-11-16 DIAGNOSIS — D225 Melanocytic nevi of trunk: Secondary | ICD-10-CM | POA: Diagnosis not present

## 2017-11-16 DIAGNOSIS — L821 Other seborrheic keratosis: Secondary | ICD-10-CM | POA: Diagnosis not present

## 2017-11-16 DIAGNOSIS — L718 Other rosacea: Secondary | ICD-10-CM | POA: Diagnosis not present

## 2017-12-16 DIAGNOSIS — G4733 Obstructive sleep apnea (adult) (pediatric): Secondary | ICD-10-CM | POA: Diagnosis not present

## 2018-04-07 DIAGNOSIS — Z9989 Dependence on other enabling machines and devices: Secondary | ICD-10-CM | POA: Diagnosis not present

## 2018-04-07 DIAGNOSIS — I493 Ventricular premature depolarization: Secondary | ICD-10-CM | POA: Diagnosis not present

## 2018-04-07 DIAGNOSIS — E785 Hyperlipidemia, unspecified: Secondary | ICD-10-CM | POA: Diagnosis not present

## 2018-04-07 DIAGNOSIS — G4733 Obstructive sleep apnea (adult) (pediatric): Secondary | ICD-10-CM | POA: Diagnosis not present

## 2018-04-07 DIAGNOSIS — Z8669 Personal history of other diseases of the nervous system and sense organs: Secondary | ICD-10-CM | POA: Diagnosis not present

## 2018-04-07 DIAGNOSIS — I10 Essential (primary) hypertension: Secondary | ICD-10-CM | POA: Diagnosis not present

## 2018-04-07 DIAGNOSIS — F418 Other specified anxiety disorders: Secondary | ICD-10-CM | POA: Diagnosis not present

## 2018-04-07 DIAGNOSIS — J309 Allergic rhinitis, unspecified: Secondary | ICD-10-CM | POA: Diagnosis not present

## 2018-06-11 DIAGNOSIS — L820 Inflamed seborrheic keratosis: Secondary | ICD-10-CM | POA: Diagnosis not present

## 2018-06-11 DIAGNOSIS — D485 Neoplasm of uncertain behavior of skin: Secondary | ICD-10-CM | POA: Diagnosis not present

## 2018-06-11 DIAGNOSIS — L570 Actinic keratosis: Secondary | ICD-10-CM | POA: Diagnosis not present

## 2018-10-08 DIAGNOSIS — I10 Essential (primary) hypertension: Secondary | ICD-10-CM | POA: Diagnosis not present

## 2018-10-08 DIAGNOSIS — Z0000 Encounter for general adult medical examination without abnormal findings: Secondary | ICD-10-CM | POA: Diagnosis not present

## 2018-10-08 DIAGNOSIS — E785 Hyperlipidemia, unspecified: Secondary | ICD-10-CM | POA: Diagnosis not present

## 2018-10-13 DIAGNOSIS — E785 Hyperlipidemia, unspecified: Secondary | ICD-10-CM | POA: Diagnosis not present

## 2018-10-13 DIAGNOSIS — I10 Essential (primary) hypertension: Secondary | ICD-10-CM | POA: Diagnosis not present

## 2018-10-13 DIAGNOSIS — G4733 Obstructive sleep apnea (adult) (pediatric): Secondary | ICD-10-CM | POA: Diagnosis not present

## 2018-10-13 DIAGNOSIS — F418 Other specified anxiety disorders: Secondary | ICD-10-CM | POA: Diagnosis not present

## 2018-10-13 DIAGNOSIS — Z9989 Dependence on other enabling machines and devices: Secondary | ICD-10-CM | POA: Diagnosis not present

## 2018-10-13 DIAGNOSIS — J309 Allergic rhinitis, unspecified: Secondary | ICD-10-CM | POA: Diagnosis not present

## 2018-11-29 DIAGNOSIS — L812 Freckles: Secondary | ICD-10-CM | POA: Diagnosis not present

## 2018-11-29 DIAGNOSIS — D1801 Hemangioma of skin and subcutaneous tissue: Secondary | ICD-10-CM | POA: Diagnosis not present

## 2018-11-29 DIAGNOSIS — L718 Other rosacea: Secondary | ICD-10-CM | POA: Diagnosis not present

## 2018-11-29 DIAGNOSIS — L570 Actinic keratosis: Secondary | ICD-10-CM | POA: Diagnosis not present

## 2018-11-29 DIAGNOSIS — L821 Other seborrheic keratosis: Secondary | ICD-10-CM | POA: Diagnosis not present

## 2018-11-29 DIAGNOSIS — L603 Nail dystrophy: Secondary | ICD-10-CM | POA: Diagnosis not present

## 2018-11-29 DIAGNOSIS — D225 Melanocytic nevi of trunk: Secondary | ICD-10-CM | POA: Diagnosis not present

## 2019-01-04 DIAGNOSIS — G4733 Obstructive sleep apnea (adult) (pediatric): Secondary | ICD-10-CM | POA: Diagnosis not present

## 2019-01-04 DIAGNOSIS — J309 Allergic rhinitis, unspecified: Secondary | ICD-10-CM | POA: Diagnosis not present

## 2019-01-04 DIAGNOSIS — Z9989 Dependence on other enabling machines and devices: Secondary | ICD-10-CM | POA: Diagnosis not present

## 2019-01-27 DIAGNOSIS — G4733 Obstructive sleep apnea (adult) (pediatric): Secondary | ICD-10-CM | POA: Diagnosis not present

## 2019-04-21 DIAGNOSIS — G4733 Obstructive sleep apnea (adult) (pediatric): Secondary | ICD-10-CM | POA: Diagnosis not present

## 2019-04-21 DIAGNOSIS — J309 Allergic rhinitis, unspecified: Secondary | ICD-10-CM | POA: Diagnosis not present

## 2019-04-21 DIAGNOSIS — I10 Essential (primary) hypertension: Secondary | ICD-10-CM | POA: Diagnosis not present

## 2019-04-21 DIAGNOSIS — E785 Hyperlipidemia, unspecified: Secondary | ICD-10-CM | POA: Diagnosis not present

## 2019-10-05 ENCOUNTER — Encounter: Payer: Self-pay | Admitting: Internal Medicine

## 2019-10-20 DIAGNOSIS — Z125 Encounter for screening for malignant neoplasm of prostate: Secondary | ICD-10-CM | POA: Diagnosis not present

## 2019-10-20 DIAGNOSIS — J309 Allergic rhinitis, unspecified: Secondary | ICD-10-CM | POA: Diagnosis not present

## 2019-10-20 DIAGNOSIS — I10 Essential (primary) hypertension: Secondary | ICD-10-CM | POA: Diagnosis not present

## 2019-10-20 DIAGNOSIS — E785 Hyperlipidemia, unspecified: Secondary | ICD-10-CM | POA: Diagnosis not present

## 2019-10-20 DIAGNOSIS — Z9989 Dependence on other enabling machines and devices: Secondary | ICD-10-CM | POA: Diagnosis not present

## 2019-10-20 DIAGNOSIS — R109 Unspecified abdominal pain: Secondary | ICD-10-CM | POA: Diagnosis not present

## 2019-10-20 DIAGNOSIS — F418 Other specified anxiety disorders: Secondary | ICD-10-CM | POA: Diagnosis not present

## 2019-10-20 DIAGNOSIS — G4733 Obstructive sleep apnea (adult) (pediatric): Secondary | ICD-10-CM | POA: Diagnosis not present

## 2019-12-08 DIAGNOSIS — R102 Pelvic and perineal pain: Secondary | ICD-10-CM | POA: Diagnosis not present

## 2019-12-08 DIAGNOSIS — M1990 Unspecified osteoarthritis, unspecified site: Secondary | ICD-10-CM | POA: Diagnosis not present

## 2019-12-08 DIAGNOSIS — M5416 Radiculopathy, lumbar region: Secondary | ICD-10-CM | POA: Diagnosis not present

## 2019-12-12 ENCOUNTER — Ambulatory Visit (AMBULATORY_SURGERY_CENTER): Payer: Self-pay

## 2019-12-12 ENCOUNTER — Other Ambulatory Visit: Payer: Self-pay

## 2019-12-12 VITALS — Ht 72.0 in | Wt 219.0 lb

## 2019-12-12 DIAGNOSIS — D2261 Melanocytic nevi of right upper limb, including shoulder: Secondary | ICD-10-CM | POA: Diagnosis not present

## 2019-12-12 DIAGNOSIS — D2272 Melanocytic nevi of left lower limb, including hip: Secondary | ICD-10-CM | POA: Diagnosis not present

## 2019-12-12 DIAGNOSIS — D225 Melanocytic nevi of trunk: Secondary | ICD-10-CM | POA: Diagnosis not present

## 2019-12-12 DIAGNOSIS — L718 Other rosacea: Secondary | ICD-10-CM | POA: Diagnosis not present

## 2019-12-12 DIAGNOSIS — L280 Lichen simplex chronicus: Secondary | ICD-10-CM | POA: Diagnosis not present

## 2019-12-12 DIAGNOSIS — D2262 Melanocytic nevi of left upper limb, including shoulder: Secondary | ICD-10-CM | POA: Diagnosis not present

## 2019-12-12 DIAGNOSIS — L400 Psoriasis vulgaris: Secondary | ICD-10-CM | POA: Diagnosis not present

## 2019-12-12 DIAGNOSIS — L218 Other seborrheic dermatitis: Secondary | ICD-10-CM | POA: Diagnosis not present

## 2019-12-12 DIAGNOSIS — Z86010 Personal history of colonic polyps: Secondary | ICD-10-CM

## 2019-12-12 DIAGNOSIS — D1801 Hemangioma of skin and subcutaneous tissue: Secondary | ICD-10-CM | POA: Diagnosis not present

## 2019-12-12 MED ORDER — NA SULFATE-K SULFATE-MG SULF 17.5-3.13-1.6 GM/177ML PO SOLN
1.0000 | Freq: Once | ORAL | 0 refills | Status: AC
Start: 2019-12-12 — End: 2019-12-12

## 2019-12-12 NOTE — Progress Notes (Signed)
No egg or soy allergy known to patient  No issues with past sedation with any surgeries or procedures No intubation problems in the past  No diet pills per patient No home 02 use per patient  No blood thinners per patient  Pt denies issues with constipation  No A fib or A flutter  EMMI video to [place] 19 guidelines implemented in PV today  COVID vaccine completed on 05/2019 per pt; Due to the COVID-19 pandemic we are asking patients to follow these guidelines. Please only bring one care partner. Please be aware that your care partner may wait in the car in the parking lot or if they feel like they will be too hot to wait in the car, they may wait in the lobby on the 4th floor. All care partners are required to wear a mask the entire time (we do not have any that we can provide them), they need to practice social distancing, and we will do a Covid check for all patient's and care partners when you arrive. Also we will check their temperature and your temperature. If the care partner waits in their car they need to stay in the parking lot the entire time and we will call them on their cell phone when the patient is ready for discharge so they can bring the car to the front of the building. Also all patient's will need to wear a mask into building.

## 2019-12-13 ENCOUNTER — Encounter: Payer: Self-pay | Admitting: Internal Medicine

## 2019-12-13 MED FILL — CLOBETASOL 0.05% SOLUTION: 0.05 | 20 days supply | Qty: 50 | Fill #0

## 2019-12-13 MED FILL — AZELAIC ACID 15 % GEL: 15 | 20 days supply | Qty: 50 | Fill #0

## 2019-12-19 ENCOUNTER — Other Ambulatory Visit (HOSPITAL_COMMUNITY): Payer: Self-pay | Admitting: Internal Medicine

## 2019-12-19 ENCOUNTER — Other Ambulatory Visit: Payer: Self-pay | Admitting: Internal Medicine

## 2019-12-20 ENCOUNTER — Other Ambulatory Visit (HOSPITAL_COMMUNITY): Payer: Self-pay | Admitting: Internal Medicine

## 2019-12-20 ENCOUNTER — Other Ambulatory Visit: Payer: Self-pay | Admitting: Internal Medicine

## 2019-12-20 DIAGNOSIS — M5416 Radiculopathy, lumbar region: Secondary | ICD-10-CM

## 2019-12-22 ENCOUNTER — Telehealth: Payer: Self-pay | Admitting: Radiology

## 2019-12-26 ENCOUNTER — Ambulatory Visit (AMBULATORY_SURGERY_CENTER): Payer: 59 | Admitting: Internal Medicine

## 2019-12-26 ENCOUNTER — Encounter: Payer: Self-pay | Admitting: Internal Medicine

## 2019-12-26 ENCOUNTER — Other Ambulatory Visit: Payer: Self-pay

## 2019-12-26 VITALS — BP 114/61 | HR 66 | Temp 98.0°F | Resp 20 | Ht 72.0 in | Wt 215.0 lb

## 2019-12-26 DIAGNOSIS — Z1211 Encounter for screening for malignant neoplasm of colon: Secondary | ICD-10-CM | POA: Diagnosis not present

## 2019-12-26 DIAGNOSIS — Z86010 Personal history of colonic polyps: Secondary | ICD-10-CM | POA: Diagnosis not present

## 2019-12-26 MED ORDER — SODIUM CHLORIDE 0.9 % IV SOLN: 500.0000 mL | Freq: Once | INTRAVENOUS | Status: DC

## 2019-12-26 NOTE — Progress Notes (Signed)
Check-in - LR CW/TB - VS  Pt's states no medical or surgical changes since previsit or office visit.Pt's states no medical or surgical changes since previsit or office visit.

## 2019-12-26 NOTE — Progress Notes (Signed)
PT taken to PACU. Monitors in place. VSS. Report given to RN. 

## 2019-12-26 NOTE — Op Note (Signed)
[place] Patient Name: Arthur Robinson Procedure Date: 12/26/2019 9:44 AM MRN: [id number] Endoscopist: Docia Chuck. Henrene Pastor , MD Age: 59 Referring MD:  Date of Birth: 1960-10-21 Gender: Male Account #: [id number] Procedure:                Colonoscopy Indications:              High risk colon cancer surveillance: Personal                            history of non-advanced adenoma. Index examination                            20 years ago was negative for neoplasia. Last                            examination April 2016 with diminutive adenomas. Medicines:                Monitored Anesthesia Care Procedure:                Pre-Anesthesia Assessment:                           - Prior to the procedure, a History and Physical                            was performed, and patient medications and                            allergies were reviewed. The patient's tolerance of                            previous anesthesia was also reviewed. The risks                            and benefits of the procedure and the sedation                            options and risks were discussed with the patient.                            All questions were answered, and informed consent                            was obtained. Prior Anticoagulants: The patient has                            taken no previous anticoagulant or antiplatelet                            agents. ASA Grade Assessment: II - A patient with                            mild systemic disease. After reviewing the risks  and benefits, the patient was deemed in                            satisfactory condition to undergo the procedure.                           After obtaining informed consent, the colonoscope                            was passed under direct vision. Throughout the                            procedure, the patient's blood pressure, pulse, and                            oxygen saturations  were monitored continuously. The                            Colonoscope was introduced through the anus and                            advanced to the the cecum, identified by                            appendiceal orifice and ileocecal valve. The                            ileocecal valve, appendiceal orifice, and rectum                            were photographed. The quality of the bowel                            preparation was excellent. The colonoscopy was                            performed without difficulty. The patient tolerated                            the procedure well. The bowel preparation used was                            SUPREP via split dose instruction. Scope In: 9:50:10 AM Scope Out: 10:03:41 AM Scope Withdrawal Time: 0 hours 9 minutes 27 seconds  Total Procedure Duration: 0 hours 13 minutes 31 seconds  Findings:                 The entire examined colon appeared normal on direct                            and retroflexion views. Complications:            No immediate complications. Estimated blood loss:  None. Estimated Blood Loss:     Estimated blood loss: none. Impression:               - The entire examined colon is normal on direct and                            retroflexion views.                           - No specimens collected. Recommendation:           - Repeat colonoscopy in 10 years for surveillance.                           - Patient has a contact number available for                            emergencies. The signs and symptoms of potential                            delayed complications were discussed with the                            patient. Return to normal activities tomorrow.                            Written discharge instructions were provided to the                            patient.                           - Resume previous diet.                           - Continue present medications. Docia Chuck.  Henrene Pastor, MD 12/26/2019 10:07:45 AM This report has been signed electronically.

## 2019-12-26 NOTE — Patient Instructions (Signed)
Read all of the handouts given to you by your recovery room nurse.  Thank-you for choosing us for your healthcare needs today.  YOU HAD AN ENDOSCOPIC PROCEDURE TODAY AT THE [facility]:   Refer to the procedure report that was given to you for any specific questions about what was found during the examination.  If the procedure report does not answer your questions, please call your gastroenterologist to clarify.  If you requested that your care partner not be given the details of your procedure findings, then the procedure report has been included in a sealed envelope for you to review at your convenience later.  YOU SHOULD EXPECT: Some feelings of bloating in the abdomen. Passage of more gas than usual.  Walking can help get rid of the air that was put into your GI tract during the procedure and reduce the bloating. If you had a lower endoscopy (such as a colonoscopy or flexible sigmoidoscopy) you may notice spotting of blood in your stool or on the toilet paper. If you underwent a bowel prep for your procedure, you may not have a normal bowel movement for a few days.  Please Note:  You might notice some irritation and congestion in your nose or some drainage.  This is from the oxygen used during your procedure.  There is no need for concern and it should clear up in a day or so.  SYMPTOMS TO REPORT IMMEDIATELY:   Following lower endoscopy (colonoscopy or flexible sigmoidoscopy):  Excessive amounts of blood in the stool  Significant tenderness or worsening of abdominal pains  Swelling of the abdomen that is new, acute  Fever of 100F or higher   For urgent or emergent issues, a gastroenterologist can be reached at any hour by calling [phone]. Do not use MyChart messaging for urgent concerns.    DIET:  We do recommend a small meal at first, but then you may proceed to your regular diet.  Drink plenty of fluids but you should avoid alcoholic beverages for 24  hours.  ACTIVITY:  You should plan to take it easy for the rest of today and you should NOT DRIVE or use heavy machinery until tomorrow (because of the sedation medicines used during the test).    FOLLOW UP: Our staff will call the number listed on your records 48-72 hours following your procedure to check on you and address any questions or concerns that you may have regarding the information given to you following your procedure. If we do not reach you, we will leave a message.  We will attempt to reach you two times.  During this call, we will ask if you have developed any symptoms of COVID 19. If you develop any symptoms (ie: fever, flu-like symptoms, shortness of breath, cough etc.) before then, please call [phone].  If you test positive for Covid 19 in the 2 weeks post procedure, please call and report this information to us.     SIGNATURES/CONFIDENTIALITY: You and/or your care partner have signed paperwork which will be entered into your electronic medical record.  These signatures attest to the fact that that the information above on your After Visit Summary has been reviewed and is understood.  Full responsibility of the confidentiality of this discharge information lies with you and/or your care-partner. 

## 2019-12-28 ENCOUNTER — Telehealth: Payer: Self-pay | Admitting: *Deleted

## 2019-12-28 NOTE — Telephone Encounter (Signed)
First follow up call attempt.  Message left on vm.

## 2019-12-28 NOTE — Telephone Encounter (Signed)
°  Follow up Call-  Call back number 12/26/2019  Post procedure Call Back phone  # [phone] cell  Permission to leave phone message Yes  Some recent data might be hidden    No answer for post procedure call back. Left message for patient to call with questions or concerns.

## 2020-01-13 ENCOUNTER — Ambulatory Visit (HOSPITAL_COMMUNITY)
Admission: RE | Admit: 2020-01-13 | Discharge: 2020-01-13 | Disposition: A | Discharge status: Home / Self Care | Payer: 59 | Source: Ambulatory Visit | Attending: Internal Medicine | Admitting: Internal Medicine

## 2020-01-13 ENCOUNTER — Other Ambulatory Visit: Payer: Self-pay

## 2020-01-13 DIAGNOSIS — M5416 Radiculopathy, lumbar region: Secondary | ICD-10-CM | POA: Insufficient documentation

## 2020-01-13 DIAGNOSIS — M4807 Spinal stenosis, lumbosacral region: Secondary | ICD-10-CM | POA: Diagnosis not present

## 2020-01-26 DIAGNOSIS — J309 Allergic rhinitis, unspecified: Secondary | ICD-10-CM | POA: Diagnosis not present

## 2020-01-26 DIAGNOSIS — Z9989 Dependence on other enabling machines and devices: Secondary | ICD-10-CM | POA: Diagnosis not present

## 2020-01-26 DIAGNOSIS — G4733 Obstructive sleep apnea (adult) (pediatric): Secondary | ICD-10-CM | POA: Diagnosis not present

## 2020-02-16 DIAGNOSIS — M5124 Other intervertebral disc displacement, thoracic region: Secondary | ICD-10-CM | POA: Diagnosis not present

## 2020-02-16 DIAGNOSIS — M5442 Lumbago with sciatica, left side: Secondary | ICD-10-CM | POA: Diagnosis not present

## 2020-02-16 DIAGNOSIS — M5136 Other intervertebral disc degeneration, lumbar region: Secondary | ICD-10-CM | POA: Diagnosis not present

## 2020-02-16 DIAGNOSIS — G959 Disease of spinal cord, unspecified: Secondary | ICD-10-CM | POA: Diagnosis not present

## 2020-02-16 DIAGNOSIS — G8929 Other chronic pain: Secondary | ICD-10-CM | POA: Diagnosis not present

## 2020-02-16 DIAGNOSIS — M5441 Lumbago with sciatica, right side: Secondary | ICD-10-CM | POA: Diagnosis not present

## 2020-02-16 DIAGNOSIS — S24109A Unspecified injury at unspecified level of thoracic spinal cord, initial encounter: Secondary | ICD-10-CM | POA: Diagnosis not present

## 2020-04-24 ENCOUNTER — Other Ambulatory Visit (HOSPITAL_COMMUNITY): Payer: Self-pay | Admitting: Internal Medicine

## 2020-04-24 DIAGNOSIS — F418 Other specified anxiety disorders: Secondary | ICD-10-CM | POA: Diagnosis not present

## 2020-04-24 DIAGNOSIS — M1990 Unspecified osteoarthritis, unspecified site: Secondary | ICD-10-CM | POA: Diagnosis not present

## 2020-04-24 DIAGNOSIS — Z9989 Dependence on other enabling machines and devices: Secondary | ICD-10-CM | POA: Diagnosis not present

## 2020-04-24 DIAGNOSIS — E785 Hyperlipidemia, unspecified: Secondary | ICD-10-CM | POA: Diagnosis not present

## 2020-04-24 DIAGNOSIS — M4714 Other spondylosis with myelopathy, thoracic region: Secondary | ICD-10-CM | POA: Diagnosis not present

## 2020-04-24 DIAGNOSIS — J309 Allergic rhinitis, unspecified: Secondary | ICD-10-CM | POA: Diagnosis not present

## 2020-04-24 DIAGNOSIS — I10 Essential (primary) hypertension: Secondary | ICD-10-CM | POA: Diagnosis not present

## 2020-04-24 DIAGNOSIS — G4733 Obstructive sleep apnea (adult) (pediatric): Secondary | ICD-10-CM | POA: Diagnosis not present

## 2020-04-24 MED FILL — ALPRAZolam 0.5 MG TABS: 0.5 | 10 days supply | Qty: 30 | Fill #0

## 2020-04-24 MED FILL — LOSARTAN POTASSIUM 50 MG TA: 50 | 90 days supply | Qty: 90 | Fill #0

## 2020-08-09 ENCOUNTER — Other Ambulatory Visit (HOSPITAL_BASED_OUTPATIENT_CLINIC_OR_DEPARTMENT_OTHER): Payer: Self-pay

## 2020-08-20 ENCOUNTER — Other Ambulatory Visit (HOSPITAL_COMMUNITY): Payer: Self-pay

## 2020-08-20 MED FILL — Losartan Potassium Tab 50 MG: ORAL | 90 days supply | Qty: 90 | Fill #0 | Status: AC

## 2020-08-21 ENCOUNTER — Other Ambulatory Visit (HOSPITAL_COMMUNITY): Payer: Self-pay

## 2020-08-21 MED ORDER — AZELAIC ACID 15 % EX GEL
CUTANEOUS | 2 refills | Status: DC
Start: 2020-08-21 — End: 2022-06-05
  Filled 2020-08-21: qty 50, 30d supply, fill #0

## 2020-08-23 ENCOUNTER — Other Ambulatory Visit (HOSPITAL_COMMUNITY): Payer: Self-pay

## 2020-08-24 ENCOUNTER — Other Ambulatory Visit (HOSPITAL_COMMUNITY): Payer: Self-pay

## 2020-08-27 ENCOUNTER — Other Ambulatory Visit (HOSPITAL_COMMUNITY): Payer: Self-pay

## 2020-10-16 DIAGNOSIS — R109 Unspecified abdominal pain: Secondary | ICD-10-CM | POA: Diagnosis not present

## 2020-10-16 DIAGNOSIS — Z0000 Encounter for general adult medical examination without abnormal findings: Secondary | ICD-10-CM | POA: Diagnosis not present

## 2020-11-07 ENCOUNTER — Other Ambulatory Visit (HOSPITAL_COMMUNITY): Payer: Self-pay

## 2020-11-07 DIAGNOSIS — E785 Hyperlipidemia, unspecified: Secondary | ICD-10-CM | POA: Diagnosis not present

## 2020-11-07 DIAGNOSIS — G4733 Obstructive sleep apnea (adult) (pediatric): Secondary | ICD-10-CM | POA: Diagnosis not present

## 2020-11-07 DIAGNOSIS — I10 Essential (primary) hypertension: Secondary | ICD-10-CM | POA: Diagnosis not present

## 2020-11-07 DIAGNOSIS — Z9989 Dependence on other enabling machines and devices: Secondary | ICD-10-CM | POA: Diagnosis not present

## 2020-11-07 DIAGNOSIS — J309 Allergic rhinitis, unspecified: Secondary | ICD-10-CM | POA: Diagnosis not present

## 2020-11-07 DIAGNOSIS — M1990 Unspecified osteoarthritis, unspecified site: Secondary | ICD-10-CM | POA: Diagnosis not present

## 2020-11-07 MED ORDER — LOSARTAN POTASSIUM 100 MG PO TABS
1.0000 | ORAL_TABLET | Freq: Every day | ORAL | 3 refills | Status: DC
  Filled 2020-11-07: qty 90, 90d supply, fill #0
  Filled 2021-02-27: qty 90, 90d supply, fill #1
  Filled 2021-05-30 – 2021-06-10 (×2): qty 90, 90d supply, fill #2
  Filled 2021-09-02: qty 90, 90d supply, fill #3

## 2020-11-08 ENCOUNTER — Other Ambulatory Visit (HOSPITAL_COMMUNITY): Payer: Self-pay

## 2020-11-09 ENCOUNTER — Other Ambulatory Visit (HOSPITAL_COMMUNITY): Payer: Self-pay

## 2020-11-09 MED ORDER — ERYTHROMYCIN 5 MG/GM OP OINT
TOPICAL_OINTMENT | OPHTHALMIC | 2 refills | Status: DC
  Filled 2020-11-09: qty 3.5, 5d supply, fill #0
  Filled 2021-02-27: qty 3.5, 5d supply, fill #1
  Filled 2021-04-19: qty 3.5, 5d supply, fill #2

## 2020-12-24 DIAGNOSIS — G4733 Obstructive sleep apnea (adult) (pediatric): Secondary | ICD-10-CM | POA: Diagnosis not present

## 2020-12-24 DIAGNOSIS — Z9989 Dependence on other enabling machines and devices: Secondary | ICD-10-CM | POA: Diagnosis not present

## 2021-02-27 ENCOUNTER — Other Ambulatory Visit (HOSPITAL_COMMUNITY): Payer: Self-pay

## 2021-03-04 DIAGNOSIS — D225 Melanocytic nevi of trunk: Secondary | ICD-10-CM | POA: Diagnosis not present

## 2021-03-04 DIAGNOSIS — L812 Freckles: Secondary | ICD-10-CM | POA: Diagnosis not present

## 2021-03-04 DIAGNOSIS — L821 Other seborrheic keratosis: Secondary | ICD-10-CM | POA: Diagnosis not present

## 2021-03-04 DIAGNOSIS — D1801 Hemangioma of skin and subcutaneous tissue: Secondary | ICD-10-CM | POA: Diagnosis not present

## 2021-03-04 DIAGNOSIS — L218 Other seborrheic dermatitis: Secondary | ICD-10-CM | POA: Diagnosis not present

## 2021-04-19 ENCOUNTER — Other Ambulatory Visit (HOSPITAL_COMMUNITY): Payer: Self-pay

## 2021-04-19 MED ORDER — CLOBETASOL PROPIONATE 0.05 % EX SOLN
CUTANEOUS | 0 refills | Status: DC
Start: 2021-04-19 — End: 2022-02-03
  Filled 2021-04-19: qty 50, 30d supply, fill #0

## 2021-05-14 DIAGNOSIS — J309 Allergic rhinitis, unspecified: Secondary | ICD-10-CM | POA: Diagnosis not present

## 2021-05-14 DIAGNOSIS — G4733 Obstructive sleep apnea (adult) (pediatric): Secondary | ICD-10-CM | POA: Diagnosis not present

## 2021-05-14 DIAGNOSIS — I10 Essential (primary) hypertension: Secondary | ICD-10-CM | POA: Diagnosis not present

## 2021-05-14 DIAGNOSIS — Z9989 Dependence on other enabling machines and devices: Secondary | ICD-10-CM | POA: Diagnosis not present

## 2021-05-14 DIAGNOSIS — E785 Hyperlipidemia, unspecified: Secondary | ICD-10-CM | POA: Diagnosis not present

## 2021-05-14 DIAGNOSIS — M1990 Unspecified osteoarthritis, unspecified site: Secondary | ICD-10-CM | POA: Diagnosis not present

## 2021-05-30 ENCOUNTER — Other Ambulatory Visit (HOSPITAL_COMMUNITY): Payer: Self-pay

## 2021-06-10 ENCOUNTER — Other Ambulatory Visit (HOSPITAL_COMMUNITY): Payer: Self-pay

## 2021-06-11 DIAGNOSIS — G4733 Obstructive sleep apnea (adult) (pediatric): Secondary | ICD-10-CM | POA: Diagnosis not present

## 2021-07-12 DIAGNOSIS — G4733 Obstructive sleep apnea (adult) (pediatric): Secondary | ICD-10-CM | POA: Diagnosis not present

## 2021-08-09 DIAGNOSIS — G4733 Obstructive sleep apnea (adult) (pediatric): Secondary | ICD-10-CM | POA: Diagnosis not present

## 2021-09-01 IMAGING — MR MR LUMBAR SPINE W/O CM
4 of 6 series · 21 of 48 positions shown · non-contrast
Comparison: None.

CLINICAL DATA: Lumbar radiculopathy

EXAM:
MRI LUMBAR SPINE WITHOUT CONTRAST
TECHNIQUE: Multiplanar, multisequence MR imaging of the lumbar spine was
performed. No intravenous contrast was administered.

[Series 2: T2 · sagittal · 4.0mm · 0.55mm/px · 5 of 17 slices shown (1 of 2)]
[im 1/17]
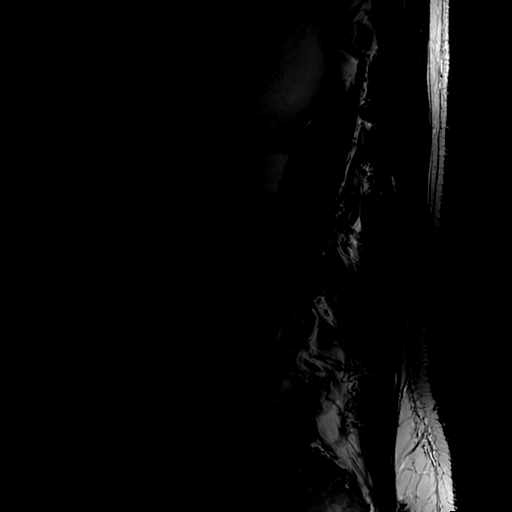
[im 5/17]
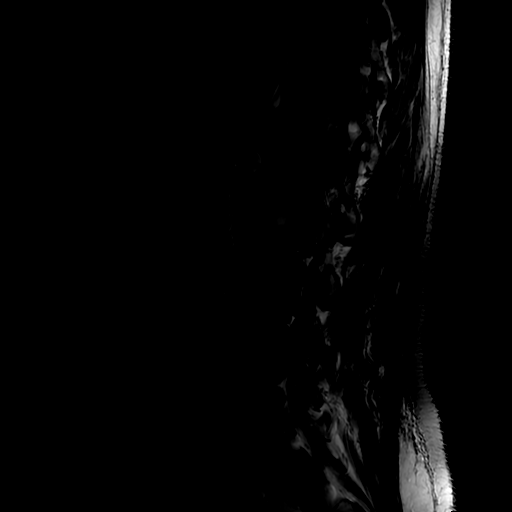
[im 9/17]
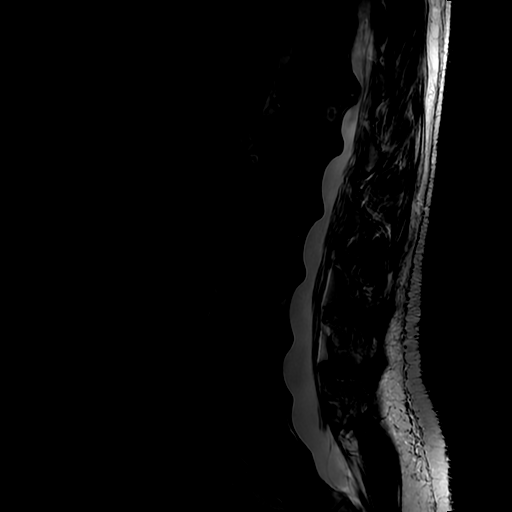
[im 13/17]
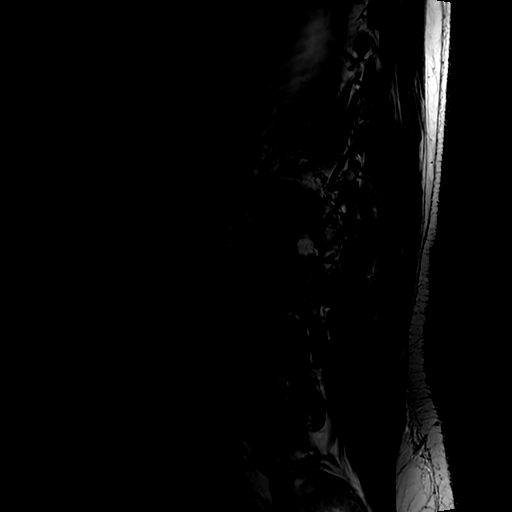
[im 17/17]
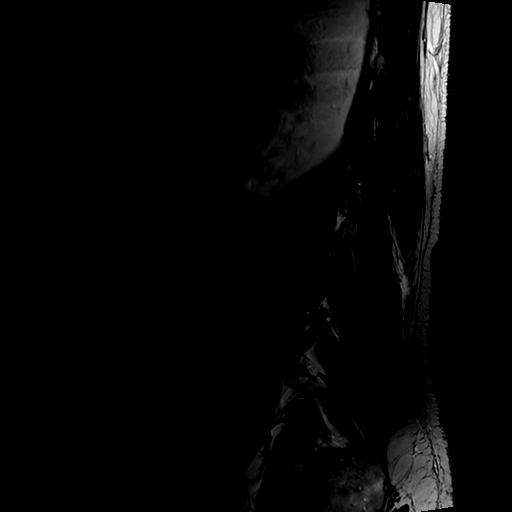

[Series 4: T2 · axial · 4.0mm · 0.39mm/px · z∈[-83,+109]mm · 10 of 42 slices shown (2 of 2)]
[im 1/42]
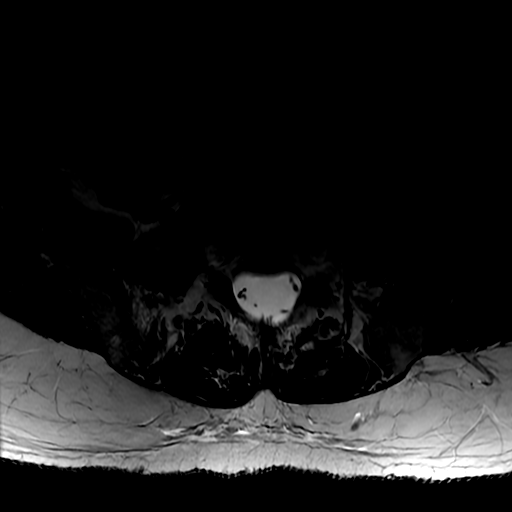
[im 5/42]
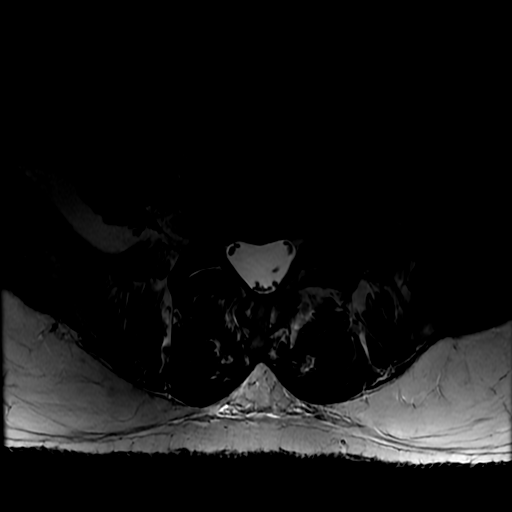
[im 9/42]
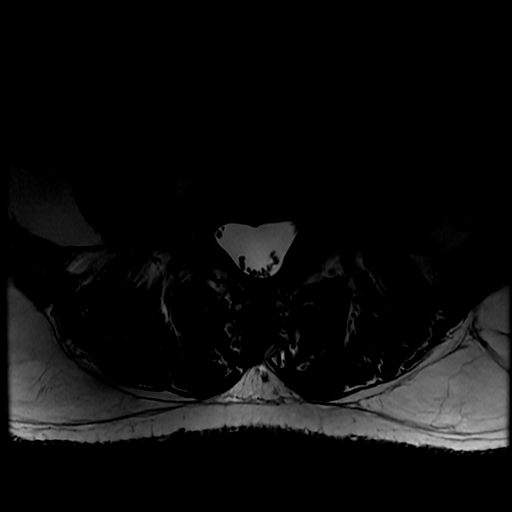
[im 13/42]
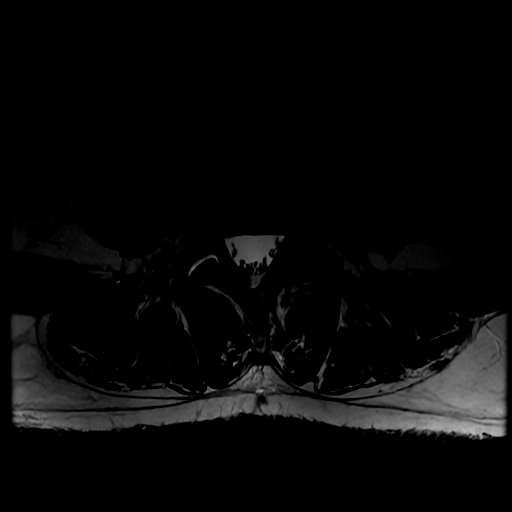
[im 17/42]
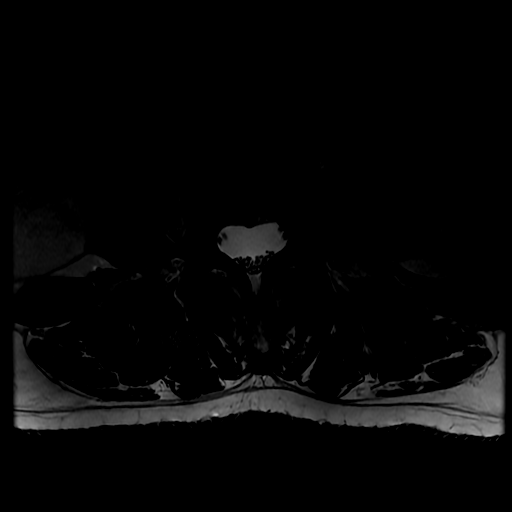
[im 21/42]
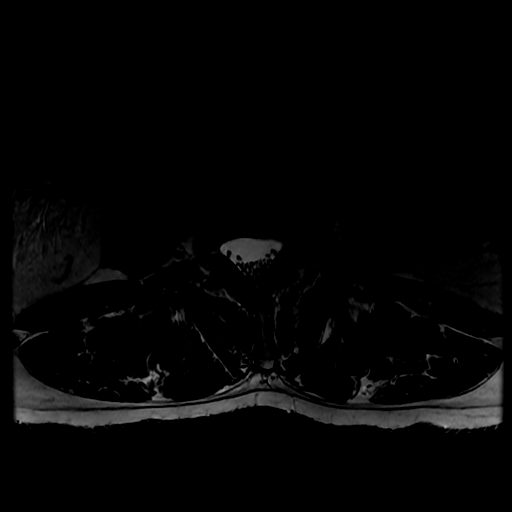
[im 25/42]
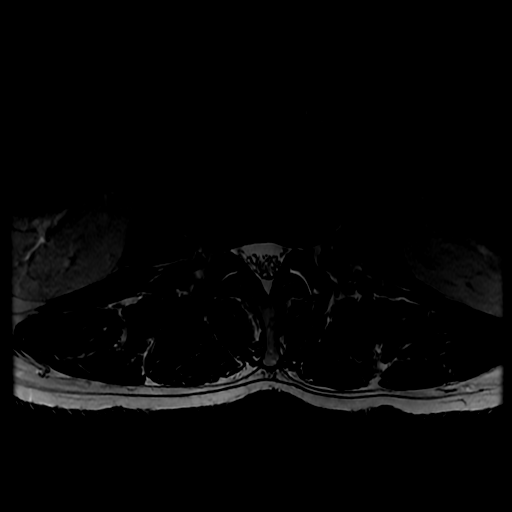
[im 29/42]
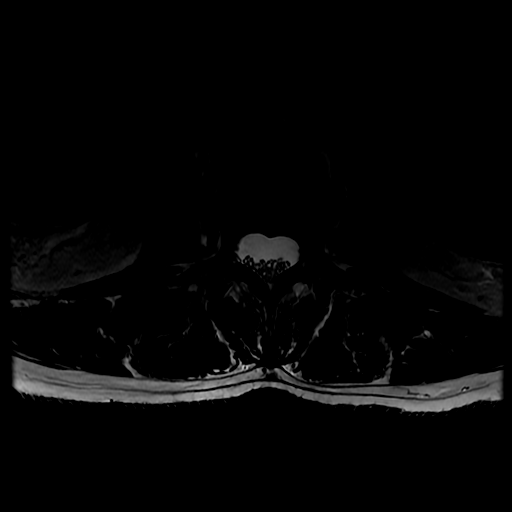
[im 33/42]
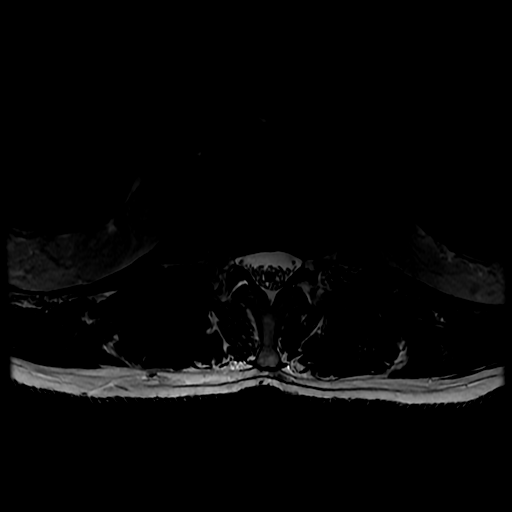
[im 37/42]
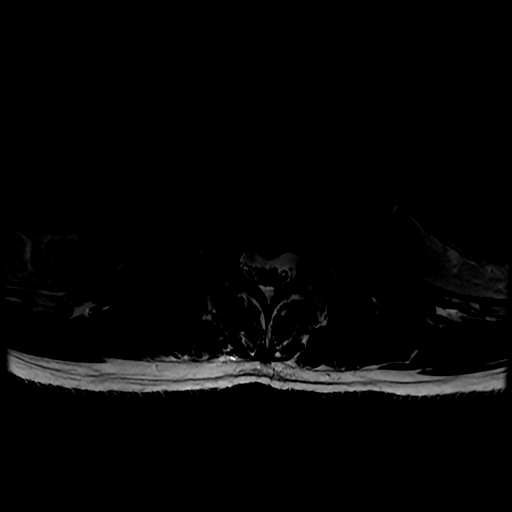

[Series 5: T1 · sagittal · 4.0mm · 0.55mm/px · 3 of 17 slices shown (1 of 2)]
[im 1/17]
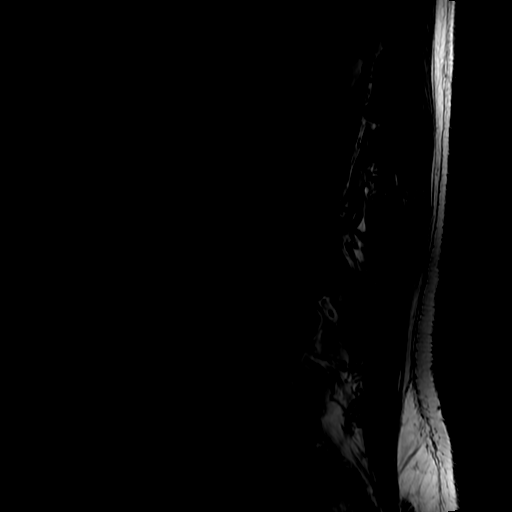
[im 9/17]
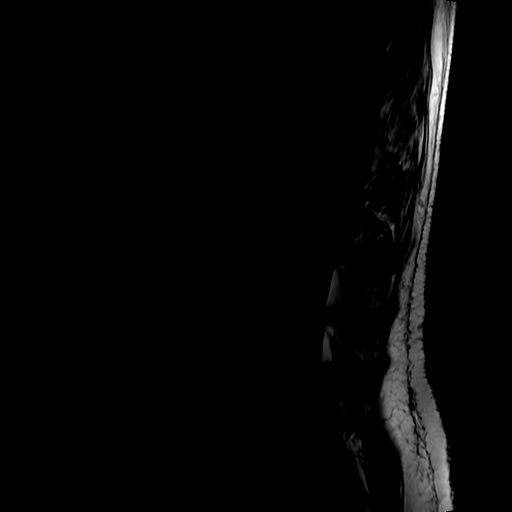
[im 17/17]
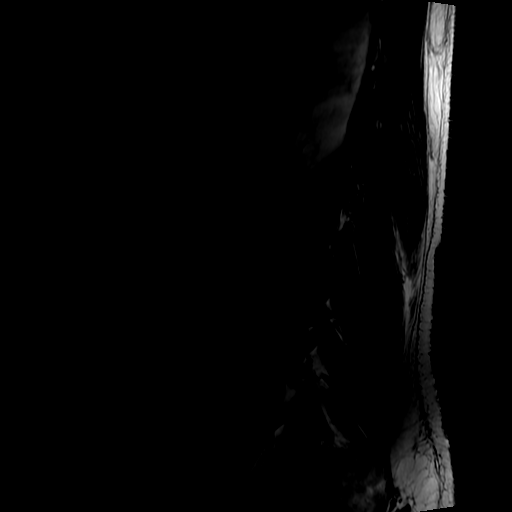

[Series 6: T1 · axial · 4.0mm · 0.78mm/px · z∈[-63,+109]mm · 3 of 42 slices shown (2 of 2)]
[im 5/42]
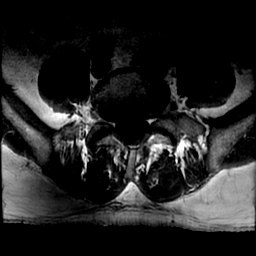
[im 21/42]
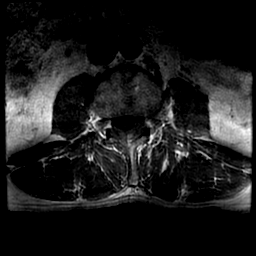
[im 37/42]
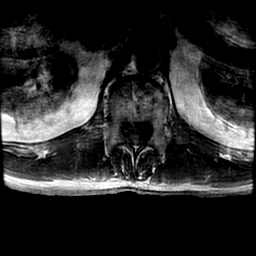

[21 of 48 positions shown; findings below may reference images not displayed]

FINDINGS: Segmentation:  Normal

Alignment:  Normal

Vertebrae:  Normal bone marrow.  Negative for fracture or mass.

Conus medullaris and cauda equina: Conus extends to the L2 level.
Hyperintensity in the conus due to disc protrusion at T12-L1.

Paraspinal and other soft tissues: Negative paraspinous mass,
adenopathy, or fluid collection.

Disc levels:

T12-L1: Moderate to large central disc protrusion with slight
downgoing disc material. Mild to moderate spinal stenosis.
Flattening of the conus medullaris with hyperintense signal in the
conus.

L1-2: Diffuse disc bulging and mild endplate spurring. Negative for
stenosis

L2-3: Diffuse bulging of the disc with endplate spurring. Mild facet
degeneration. Mild subarticular stenosis bilaterally.

L3-4: Disc degeneration with diffuse disc bulging and endplate
spurring. Small left foraminal disc protrusion and extraforaminal
disc protrusion. Mild subarticular stenosis on the left. Possible
left L3 nerve root irritation lateral to the foramen. Spinal canal
adequate in size. Mild facet hypertrophy bilaterally.

L4-5: Diffuse bulging of the disc with endplate spurring. Mild facet
degeneration. Moderate subarticular stenosis bilaterally.

L5-S1: Mild disc bulging. Mild facet hypertrophy. Moderate
subarticular stenosis on the right and mild subarticular stenosis on
the left.
IMPRESSION: 1. Moderate to large central disc protrusion T12-L1 causing spinal
stenosis, flattening of the conus medullaris and hyperintense signal
in the conus.
2. Multilevel degenerative change in the lumbar spine as described
above.

## 2021-09-02 ENCOUNTER — Other Ambulatory Visit (HOSPITAL_COMMUNITY): Payer: Self-pay

## 2021-09-09 DIAGNOSIS — G4733 Obstructive sleep apnea (adult) (pediatric): Secondary | ICD-10-CM | POA: Diagnosis not present

## 2021-09-17 ENCOUNTER — Other Ambulatory Visit (HOSPITAL_COMMUNITY): Payer: Self-pay

## 2021-09-18 ENCOUNTER — Other Ambulatory Visit (HOSPITAL_COMMUNITY): Payer: Self-pay

## 2021-09-18 MED ORDER — ERYTHROMYCIN 5 MG/GM OP OINT
TOPICAL_OINTMENT | OPHTHALMIC | 2 refills | Status: DC
  Filled 2021-09-18: qty 3.5, 5d supply, fill #0

## 2021-10-09 DIAGNOSIS — G4733 Obstructive sleep apnea (adult) (pediatric): Secondary | ICD-10-CM | POA: Diagnosis not present

## 2021-11-06 DIAGNOSIS — Z0000 Encounter for general adult medical examination without abnormal findings: Secondary | ICD-10-CM | POA: Diagnosis not present

## 2021-11-06 DIAGNOSIS — E785 Hyperlipidemia, unspecified: Secondary | ICD-10-CM | POA: Diagnosis not present

## 2021-11-09 DIAGNOSIS — G4733 Obstructive sleep apnea (adult) (pediatric): Secondary | ICD-10-CM | POA: Diagnosis not present

## 2021-11-12 ENCOUNTER — Other Ambulatory Visit (HOSPITAL_COMMUNITY): Payer: Self-pay

## 2021-11-12 DIAGNOSIS — E785 Hyperlipidemia, unspecified: Secondary | ICD-10-CM | POA: Diagnosis not present

## 2021-11-12 DIAGNOSIS — M1990 Unspecified osteoarthritis, unspecified site: Secondary | ICD-10-CM | POA: Diagnosis not present

## 2021-11-12 DIAGNOSIS — Z9989 Dependence on other enabling machines and devices: Secondary | ICD-10-CM | POA: Diagnosis not present

## 2021-11-12 DIAGNOSIS — F418 Other specified anxiety disorders: Secondary | ICD-10-CM | POA: Diagnosis not present

## 2021-11-12 DIAGNOSIS — G4733 Obstructive sleep apnea (adult) (pediatric): Secondary | ICD-10-CM | POA: Diagnosis not present

## 2021-11-12 DIAGNOSIS — J309 Allergic rhinitis, unspecified: Secondary | ICD-10-CM | POA: Diagnosis not present

## 2021-11-12 DIAGNOSIS — I10 Essential (primary) hypertension: Secondary | ICD-10-CM | POA: Diagnosis not present

## 2021-11-12 MED ORDER — LOSARTAN POTASSIUM 100 MG PO TABS
100.0000 mg | ORAL_TABLET | Freq: Every day | ORAL | 3 refills | Status: AC
  Filled 2021-11-12: qty 90, 90d supply, fill #0
  Filled 2022-02-01: qty 90, 90d supply, fill #1
  Filled 2022-05-01: qty 90, 90d supply, fill #2
  Filled 2022-08-06: qty 90, 90d supply, fill #3

## 2021-11-13 ENCOUNTER — Other Ambulatory Visit (HOSPITAL_COMMUNITY): Payer: Self-pay

## 2021-12-09 DIAGNOSIS — G4733 Obstructive sleep apnea (adult) (pediatric): Secondary | ICD-10-CM | POA: Diagnosis not present

## 2021-12-26 DIAGNOSIS — L820 Inflamed seborrheic keratosis: Secondary | ICD-10-CM | POA: Diagnosis not present

## 2021-12-26 DIAGNOSIS — D1801 Hemangioma of skin and subcutaneous tissue: Secondary | ICD-10-CM | POA: Diagnosis not present

## 2021-12-26 DIAGNOSIS — L03031 Cellulitis of right toe: Secondary | ICD-10-CM | POA: Diagnosis not present

## 2021-12-26 DIAGNOSIS — L812 Freckles: Secondary | ICD-10-CM | POA: Diagnosis not present

## 2021-12-26 DIAGNOSIS — L03011 Cellulitis of right finger: Secondary | ICD-10-CM | POA: Diagnosis not present

## 2021-12-26 DIAGNOSIS — D2261 Melanocytic nevi of right upper limb, including shoulder: Secondary | ICD-10-CM | POA: Diagnosis not present

## 2021-12-26 DIAGNOSIS — D225 Melanocytic nevi of trunk: Secondary | ICD-10-CM | POA: Diagnosis not present

## 2022-01-09 DIAGNOSIS — G4733 Obstructive sleep apnea (adult) (pediatric): Secondary | ICD-10-CM | POA: Diagnosis not present

## 2022-01-29 ENCOUNTER — Other Ambulatory Visit (HOSPITAL_COMMUNITY): Payer: Self-pay

## 2022-01-29 MED ORDER — BACITRACIN 500 UNIT/GM OP OINT
TOPICAL_OINTMENT | OPHTHALMIC | 12 refills | Status: AC
  Filled 2022-01-29: qty 3.5, 7d supply, fill #0
  Filled 2022-06-04: qty 3.5, 7d supply, fill #1
  Filled 2022-09-30: qty 3.5, 7d supply, fill #2

## 2022-01-30 ENCOUNTER — Other Ambulatory Visit (HOSPITAL_COMMUNITY): Payer: Self-pay

## 2022-02-01 ENCOUNTER — Other Ambulatory Visit (HOSPITAL_COMMUNITY): Payer: Self-pay

## 2022-02-03 ENCOUNTER — Other Ambulatory Visit (HOSPITAL_COMMUNITY): Payer: Self-pay

## 2022-02-03 MED ORDER — CLOBETASOL PROPIONATE 0.05 % EX SOLN
CUTANEOUS | 2 refills | Status: AC
  Filled 2022-02-03: qty 50, 25d supply, fill #0
  Filled 2022-06-04: qty 50, 25d supply, fill #1
  Filled 2022-09-30: qty 50, 25d supply, fill #2

## 2022-02-09 DIAGNOSIS — G4733 Obstructive sleep apnea (adult) (pediatric): Secondary | ICD-10-CM | POA: Diagnosis not present

## 2022-03-11 DIAGNOSIS — G4733 Obstructive sleep apnea (adult) (pediatric): Secondary | ICD-10-CM | POA: Diagnosis not present

## 2022-04-11 DIAGNOSIS — G4733 Obstructive sleep apnea (adult) (pediatric): Secondary | ICD-10-CM | POA: Diagnosis not present

## 2022-04-29 DIAGNOSIS — G4733 Obstructive sleep apnea (adult) (pediatric): Secondary | ICD-10-CM | POA: Diagnosis not present

## 2022-05-02 ENCOUNTER — Other Ambulatory Visit: Payer: Self-pay

## 2022-05-02 ENCOUNTER — Other Ambulatory Visit (HOSPITAL_COMMUNITY): Payer: Self-pay

## 2022-05-03 ENCOUNTER — Other Ambulatory Visit (HOSPITAL_COMMUNITY): Payer: Self-pay

## 2022-05-05 ENCOUNTER — Other Ambulatory Visit (HOSPITAL_COMMUNITY): Payer: Self-pay

## 2022-05-05 ENCOUNTER — Other Ambulatory Visit: Payer: Self-pay

## 2022-05-05 MED ORDER — ALPRAZOLAM 0.5 MG PO TABS
0.5000 mg | ORAL_TABLET | Freq: Three times a day (TID) | ORAL | 5 refills | Status: DC | PRN
  Filled 2022-05-05: qty 30, 10d supply, fill #0
  Filled 2022-09-30: qty 30, 10d supply, fill #1

## 2022-06-04 ENCOUNTER — Other Ambulatory Visit (HOSPITAL_COMMUNITY): Payer: Self-pay

## 2022-06-05 ENCOUNTER — Other Ambulatory Visit (HOSPITAL_COMMUNITY): Payer: Self-pay

## 2022-06-05 MED ORDER — AZELAIC ACID 15 % EX GEL
CUTANEOUS | 2 refills | Status: DC
  Filled 2022-06-05: qty 50, 30d supply, fill #0

## 2022-08-07 ENCOUNTER — Other Ambulatory Visit (HOSPITAL_COMMUNITY): Payer: Self-pay

## 2022-09-30 ENCOUNTER — Other Ambulatory Visit (HOSPITAL_COMMUNITY): Payer: Self-pay

## 2022-09-30 ENCOUNTER — Other Ambulatory Visit: Payer: Self-pay

## 2022-10-03 ENCOUNTER — Other Ambulatory Visit (HOSPITAL_COMMUNITY): Payer: Self-pay

## 2022-10-03 ENCOUNTER — Other Ambulatory Visit: Payer: Self-pay

## 2022-10-03 MED ORDER — ERYTHROMYCIN 5 MG/GM OP OINT
TOPICAL_OINTMENT | OPHTHALMIC | 2 refills | Status: DC
  Filled 2022-10-03: qty 3.5, 5d supply, fill #0

## 2022-10-06 ENCOUNTER — Other Ambulatory Visit (HOSPITAL_COMMUNITY): Payer: Self-pay

## 2022-10-17 ENCOUNTER — Other Ambulatory Visit (HOSPITAL_COMMUNITY): Payer: Self-pay

## 2022-10-23 DIAGNOSIS — Z83518 Family history of other specified eye disorder: Secondary | ICD-10-CM | POA: Diagnosis not present

## 2022-10-23 DIAGNOSIS — H43811 Vitreous degeneration, right eye: Secondary | ICD-10-CM | POA: Diagnosis not present

## 2022-10-23 DIAGNOSIS — I10 Essential (primary) hypertension: Secondary | ICD-10-CM | POA: Diagnosis not present

## 2022-10-23 DIAGNOSIS — H35443 Age-related reticular degeneration of retina, bilateral: Secondary | ICD-10-CM | POA: Diagnosis not present

## 2022-10-23 DIAGNOSIS — H1789 Other corneal scars and opacities: Secondary | ICD-10-CM | POA: Diagnosis not present

## 2022-10-23 DIAGNOSIS — H02835 Dermatochalasis of left lower eyelid: Secondary | ICD-10-CM | POA: Diagnosis not present

## 2022-10-23 DIAGNOSIS — H35013 Changes in retinal vascular appearance, bilateral: Secondary | ICD-10-CM | POA: Diagnosis not present

## 2022-10-23 DIAGNOSIS — H02832 Dermatochalasis of right lower eyelid: Secondary | ICD-10-CM | POA: Diagnosis not present

## 2022-10-23 DIAGNOSIS — H2513 Age-related nuclear cataract, bilateral: Secondary | ICD-10-CM | POA: Diagnosis not present

## 2022-11-11 DIAGNOSIS — H1789 Other corneal scars and opacities: Secondary | ICD-10-CM | POA: Diagnosis not present

## 2022-11-11 DIAGNOSIS — H43811 Vitreous degeneration, right eye: Secondary | ICD-10-CM | POA: Diagnosis not present

## 2022-11-11 DIAGNOSIS — H2513 Age-related nuclear cataract, bilateral: Secondary | ICD-10-CM | POA: Diagnosis not present

## 2022-11-19 ENCOUNTER — Other Ambulatory Visit (HOSPITAL_COMMUNITY): Payer: Self-pay

## 2022-11-19 ENCOUNTER — Other Ambulatory Visit: Payer: Self-pay

## 2022-11-19 DIAGNOSIS — D649 Anemia, unspecified: Secondary | ICD-10-CM | POA: Diagnosis not present

## 2022-11-19 DIAGNOSIS — J309 Allergic rhinitis, unspecified: Secondary | ICD-10-CM | POA: Diagnosis not present

## 2022-11-19 DIAGNOSIS — F418 Other specified anxiety disorders: Secondary | ICD-10-CM | POA: Diagnosis not present

## 2022-11-19 DIAGNOSIS — M1990 Unspecified osteoarthritis, unspecified site: Secondary | ICD-10-CM | POA: Diagnosis not present

## 2022-11-19 DIAGNOSIS — E785 Hyperlipidemia, unspecified: Secondary | ICD-10-CM | POA: Diagnosis not present

## 2022-11-19 DIAGNOSIS — N400 Enlarged prostate without lower urinary tract symptoms: Secondary | ICD-10-CM | POA: Diagnosis not present

## 2022-11-19 DIAGNOSIS — I10 Essential (primary) hypertension: Secondary | ICD-10-CM | POA: Diagnosis not present

## 2022-11-19 DIAGNOSIS — Z0000 Encounter for general adult medical examination without abnormal findings: Secondary | ICD-10-CM | POA: Diagnosis not present

## 2022-11-19 DIAGNOSIS — G4733 Obstructive sleep apnea (adult) (pediatric): Secondary | ICD-10-CM | POA: Diagnosis not present

## 2022-11-19 MED ORDER — LOSARTAN POTASSIUM 100 MG PO TABS
100.0000 mg | ORAL_TABLET | Freq: Every day | ORAL | 3 refills | Status: DC
  Filled 2022-11-19: qty 90, 90d supply, fill #0
  Filled 2023-03-01: qty 90, 90d supply, fill #1
  Filled 2023-05-14: qty 90, 90d supply, fill #2
  Filled 2023-08-25: qty 90, 90d supply, fill #3

## 2022-11-19 MED ORDER — TAMSULOSIN HCL 0.4 MG PO CAPS
0.4000 mg | ORAL_CAPSULE | Freq: Every day | ORAL | 11 refills | Status: AC
  Filled 2022-11-19: qty 30, 30d supply, fill #0
  Filled 2023-03-01: qty 30, 30d supply, fill #1

## 2022-12-08 DIAGNOSIS — D649 Anemia, unspecified: Secondary | ICD-10-CM | POA: Diagnosis not present

## 2022-12-08 DIAGNOSIS — E538 Deficiency of other specified B group vitamins: Secondary | ICD-10-CM | POA: Diagnosis not present

## 2023-01-21 DIAGNOSIS — H02832 Dermatochalasis of right lower eyelid: Secondary | ICD-10-CM | POA: Diagnosis not present

## 2023-01-21 DIAGNOSIS — H02835 Dermatochalasis of left lower eyelid: Secondary | ICD-10-CM | POA: Diagnosis not present

## 2023-01-21 DIAGNOSIS — I10 Essential (primary) hypertension: Secondary | ICD-10-CM | POA: Diagnosis not present

## 2023-01-21 DIAGNOSIS — H1789 Other corneal scars and opacities: Secondary | ICD-10-CM | POA: Diagnosis not present

## 2023-01-21 DIAGNOSIS — H43813 Vitreous degeneration, bilateral: Secondary | ICD-10-CM | POA: Diagnosis not present

## 2023-01-21 DIAGNOSIS — H25813 Combined forms of age-related cataract, bilateral: Secondary | ICD-10-CM | POA: Diagnosis not present

## 2023-02-25 DIAGNOSIS — H43813 Vitreous degeneration, bilateral: Secondary | ICD-10-CM | POA: Diagnosis not present

## 2023-02-25 DIAGNOSIS — H25812 Combined forms of age-related cataract, left eye: Secondary | ICD-10-CM | POA: Diagnosis not present

## 2023-02-25 DIAGNOSIS — H11152 Pinguecula, left eye: Secondary | ICD-10-CM | POA: Diagnosis not present

## 2023-03-01 ENCOUNTER — Other Ambulatory Visit (HOSPITAL_COMMUNITY): Payer: Self-pay

## 2023-03-02 ENCOUNTER — Other Ambulatory Visit: Payer: Self-pay

## 2023-03-02 ENCOUNTER — Other Ambulatory Visit (HOSPITAL_COMMUNITY): Payer: Self-pay

## 2023-03-02 MED ORDER — ALPRAZOLAM 0.5 MG PO TABS
0.5000 mg | ORAL_TABLET | Freq: Three times a day (TID) | ORAL | 5 refills | Status: DC | PRN
  Filled 2023-03-02: qty 30, 10d supply, fill #0

## 2023-03-03 ENCOUNTER — Other Ambulatory Visit (HOSPITAL_COMMUNITY): Payer: Self-pay

## 2023-03-29 ENCOUNTER — Other Ambulatory Visit (HOSPITAL_COMMUNITY): Payer: Self-pay

## 2023-03-30 ENCOUNTER — Other Ambulatory Visit (HOSPITAL_COMMUNITY): Payer: Self-pay

## 2023-04-01 ENCOUNTER — Other Ambulatory Visit: Payer: Self-pay

## 2023-04-01 ENCOUNTER — Other Ambulatory Visit (HOSPITAL_COMMUNITY): Payer: Self-pay

## 2023-04-01 MED ORDER — BACITRACIN 500 UNIT/GM OP OINT
1.0000 | TOPICAL_OINTMENT | Freq: Every day | OPHTHALMIC | 11 refills | Status: DC
  Filled 2023-04-01: qty 3.5, 3d supply, fill #0

## 2023-04-02 ENCOUNTER — Other Ambulatory Visit (HOSPITAL_COMMUNITY): Payer: Self-pay

## 2023-04-02 ENCOUNTER — Other Ambulatory Visit: Payer: Self-pay

## 2023-04-02 MED ORDER — BACITRACIN-POLYMYXIN B 500-10000 UNIT/GM OP OINT
1.0000 | TOPICAL_OINTMENT | Freq: Every day | OPHTHALMIC | 5 refills | Status: DC
  Filled 2023-04-02: qty 3.5, 10d supply, fill #0
  Filled 2023-05-14: qty 3.5, 10d supply, fill #1

## 2023-04-03 ENCOUNTER — Other Ambulatory Visit: Payer: Self-pay

## 2023-04-03 ENCOUNTER — Other Ambulatory Visit (HOSPITAL_COMMUNITY): Payer: Self-pay

## 2023-05-11 ENCOUNTER — Other Ambulatory Visit (HOSPITAL_COMMUNITY): Payer: Self-pay

## 2023-05-11 DIAGNOSIS — D1801 Hemangioma of skin and subcutaneous tissue: Secondary | ICD-10-CM | POA: Diagnosis not present

## 2023-05-11 DIAGNOSIS — D225 Melanocytic nevi of trunk: Secondary | ICD-10-CM | POA: Diagnosis not present

## 2023-05-11 DIAGNOSIS — L820 Inflamed seborrheic keratosis: Secondary | ICD-10-CM | POA: Diagnosis not present

## 2023-05-11 DIAGNOSIS — L821 Other seborrheic keratosis: Secondary | ICD-10-CM | POA: Diagnosis not present

## 2023-05-11 DIAGNOSIS — L218 Other seborrheic dermatitis: Secondary | ICD-10-CM | POA: Diagnosis not present

## 2023-05-11 MED ORDER — CLOBETASOL PROPIONATE 0.05 % EX SOLN
1.0000 | Freq: Two times a day (BID) | CUTANEOUS | 3 refills | Status: DC | PRN
  Filled 2023-05-11: qty 50, 25d supply, fill #0
  Filled 2023-09-10: qty 50, 25d supply, fill #1
  Filled 2024-02-15: qty 50, 25d supply, fill #2
  Filled 2024-03-30 (×2): qty 50, 25d supply, fill #3

## 2023-05-11 MED ORDER — AZELAIC ACID 15 % EX GEL
1.0000 | Freq: Two times a day (BID) | CUTANEOUS | 2 refills | Status: AC
  Filled 2023-05-11: qty 50, 30d supply, fill #0

## 2023-05-12 ENCOUNTER — Other Ambulatory Visit: Payer: Self-pay

## 2023-05-14 ENCOUNTER — Other Ambulatory Visit (HOSPITAL_COMMUNITY): Payer: Self-pay

## 2023-05-14 ENCOUNTER — Other Ambulatory Visit: Payer: Self-pay

## 2023-05-16 ENCOUNTER — Other Ambulatory Visit (HOSPITAL_COMMUNITY): Payer: Self-pay

## 2023-05-18 ENCOUNTER — Other Ambulatory Visit: Payer: Self-pay

## 2023-07-05 ENCOUNTER — Other Ambulatory Visit (HOSPITAL_COMMUNITY): Payer: Self-pay

## 2023-07-06 MED ORDER — ERYTHROMYCIN 5 MG/GM OP OINT
TOPICAL_OINTMENT | OPHTHALMIC | 2 refills | Status: DC
  Filled 2023-07-06: qty 3.5, 5d supply, fill #0

## 2023-07-07 ENCOUNTER — Other Ambulatory Visit (HOSPITAL_COMMUNITY): Payer: Self-pay

## 2023-07-15 DIAGNOSIS — H02832 Dermatochalasis of right lower eyelid: Secondary | ICD-10-CM | POA: Diagnosis not present

## 2023-07-15 DIAGNOSIS — H02835 Dermatochalasis of left lower eyelid: Secondary | ICD-10-CM | POA: Diagnosis not present

## 2023-08-25 ENCOUNTER — Other Ambulatory Visit (HOSPITAL_COMMUNITY): Payer: Self-pay

## 2023-09-10 ENCOUNTER — Other Ambulatory Visit (HOSPITAL_COMMUNITY): Payer: Self-pay

## 2023-09-10 ENCOUNTER — Other Ambulatory Visit: Payer: Self-pay

## 2023-09-11 ENCOUNTER — Other Ambulatory Visit (HOSPITAL_COMMUNITY): Payer: Self-pay

## 2023-09-11 MED ORDER — ALPRAZOLAM 0.5 MG PO TABS
0.5000 mg | ORAL_TABLET | Freq: Three times a day (TID) | ORAL | 5 refills | Status: AC | PRN
  Filled 2023-09-11: qty 30, 10d supply, fill #0
  Filled 2024-02-15: qty 30, 10d supply, fill #1

## 2023-09-14 ENCOUNTER — Other Ambulatory Visit: Payer: Self-pay

## 2023-09-17 DIAGNOSIS — Z6826 Body mass index (BMI) 26.0-26.9, adult: Secondary | ICD-10-CM | POA: Diagnosis not present

## 2023-09-17 DIAGNOSIS — G959 Disease of spinal cord, unspecified: Secondary | ICD-10-CM | POA: Diagnosis not present

## 2023-10-06 ENCOUNTER — Other Ambulatory Visit (HOSPITAL_COMMUNITY): Payer: Self-pay

## 2023-10-06 ENCOUNTER — Other Ambulatory Visit: Payer: Self-pay

## 2023-10-06 MED ORDER — NEOMYCIN-POLYMYXIN-DEXAMETH 3.5-10000-0.1 OP OINT
TOPICAL_OINTMENT | Freq: Four times a day (QID) | OPHTHALMIC | 1 refills | Status: AC
  Filled 2023-10-06: qty 3.5, 7d supply, fill #0

## 2023-10-06 MED ORDER — TRAMADOL HCL 50 MG PO TABS
50.0000 mg | ORAL_TABLET | Freq: Four times a day (QID) | ORAL | 0 refills | Status: DC | PRN
  Filled 2023-10-06: qty 10, 3d supply, fill #0

## 2023-11-21 ENCOUNTER — Other Ambulatory Visit (HOSPITAL_COMMUNITY): Payer: Self-pay

## 2023-11-23 ENCOUNTER — Other Ambulatory Visit (HOSPITAL_COMMUNITY): Payer: Self-pay

## 2023-11-23 ENCOUNTER — Other Ambulatory Visit: Payer: Self-pay

## 2023-11-23 MED ORDER — LOSARTAN POTASSIUM 100 MG PO TABS
100.0000 mg | ORAL_TABLET | Freq: Every day | ORAL | 3 refills | Status: AC
  Filled 2023-11-23: qty 90, 90d supply, fill #0
  Filled 2024-02-15: qty 90, 90d supply, fill #1
  Filled 2024-05-17: qty 90, 90d supply, fill #2

## 2024-01-28 DIAGNOSIS — Z01810 Encounter for preprocedural cardiovascular examination: Secondary | ICD-10-CM | POA: Diagnosis not present

## 2024-01-28 DIAGNOSIS — K4090 Unilateral inguinal hernia, without obstruction or gangrene, not specified as recurrent: Secondary | ICD-10-CM | POA: Diagnosis not present

## 2024-02-16 ENCOUNTER — Other Ambulatory Visit (HOSPITAL_COMMUNITY): Payer: Self-pay

## 2024-02-19 ENCOUNTER — Other Ambulatory Visit (HOSPITAL_COMMUNITY): Payer: Self-pay

## 2024-02-29 ENCOUNTER — Other Ambulatory Visit (HOSPITAL_COMMUNITY): Payer: Self-pay

## 2024-02-29 ENCOUNTER — Other Ambulatory Visit: Payer: Self-pay

## 2024-02-29 DIAGNOSIS — E785 Hyperlipidemia, unspecified: Secondary | ICD-10-CM | POA: Diagnosis not present

## 2024-02-29 DIAGNOSIS — N400 Enlarged prostate without lower urinary tract symptoms: Secondary | ICD-10-CM | POA: Diagnosis not present

## 2024-02-29 DIAGNOSIS — Z0000 Encounter for general adult medical examination without abnormal findings: Secondary | ICD-10-CM | POA: Diagnosis not present

## 2024-02-29 DIAGNOSIS — M1990 Unspecified osteoarthritis, unspecified site: Secondary | ICD-10-CM | POA: Diagnosis not present

## 2024-02-29 DIAGNOSIS — G4733 Obstructive sleep apnea (adult) (pediatric): Secondary | ICD-10-CM | POA: Diagnosis not present

## 2024-02-29 MED ORDER — HYDROCHLOROTHIAZIDE 12.5 MG PO TABS
12.5000 mg | ORAL_TABLET | Freq: Every day | ORAL | 11 refills | Status: AC
  Filled 2024-02-29: qty 30, 30d supply, fill #0

## 2024-02-29 MED ORDER — ESCITALOPRAM OXALATE 5 MG PO TABS
5.0000 mg | ORAL_TABLET | Freq: Every day | ORAL | 11 refills | Status: AC
  Filled 2024-02-29: qty 30, 30d supply, fill #0

## 2024-03-29 ENCOUNTER — Other Ambulatory Visit (HOSPITAL_COMMUNITY): Payer: Self-pay

## 2024-03-29 ENCOUNTER — Other Ambulatory Visit: Payer: Self-pay

## 2024-03-29 MED ORDER — ESCITALOPRAM OXALATE 5 MG PO TABS
5.0000 mg | ORAL_TABLET | Freq: Every day | ORAL | 3 refills | Status: AC
  Filled 2024-03-29 (×2): qty 90, 90d supply, fill #0
  Filled 2024-05-17 – 2024-06-16 (×2): qty 90, 90d supply, fill #1

## 2024-03-29 MED ORDER — HYDROCHLOROTHIAZIDE 12.5 MG PO TABS
12.5000 mg | ORAL_TABLET | Freq: Every day | ORAL | 3 refills | Status: AC
  Filled 2024-03-29: qty 90, 90d supply, fill #0
  Filled 2024-05-17 – 2024-06-16 (×2): qty 90, 90d supply, fill #1

## 2024-03-30 ENCOUNTER — Other Ambulatory Visit (HOSPITAL_BASED_OUTPATIENT_CLINIC_OR_DEPARTMENT_OTHER): Payer: Self-pay

## 2024-03-30 ENCOUNTER — Other Ambulatory Visit: Payer: Self-pay

## 2024-03-31 ENCOUNTER — Other Ambulatory Visit: Payer: Self-pay

## 2024-05-17 ENCOUNTER — Other Ambulatory Visit (HOSPITAL_COMMUNITY): Payer: Self-pay

## 2024-05-17 ENCOUNTER — Other Ambulatory Visit: Payer: Self-pay

## 2024-05-17 MED ORDER — CLOBETASOL PROPIONATE 0.05 % EX SOLN
1.0000 | Freq: Two times a day (BID) | CUTANEOUS | 2 refills | Status: AC | PRN
  Filled 2024-05-17: qty 50, 25d supply, fill #0
  Filled 2024-06-16: qty 50, 25d supply, fill #1

## 2024-05-18 ENCOUNTER — Other Ambulatory Visit: Payer: Self-pay

## 2024-05-18 ENCOUNTER — Other Ambulatory Visit (HOSPITAL_COMMUNITY): Payer: Self-pay

## 2024-06-01 ENCOUNTER — Other Ambulatory Visit (HOSPITAL_COMMUNITY): Payer: Self-pay

## 2024-06-01 ENCOUNTER — Other Ambulatory Visit: Payer: Self-pay

## 2024-06-01 MED ORDER — TRIAMCINOLONE ACETONIDE 0.1 % EX CREA
TOPICAL_CREAM | Freq: Two times a day (BID) | CUTANEOUS | 2 refills | Status: AC
  Filled 2024-06-01: qty 80, 30d supply, fill #0

## 2024-06-16 ENCOUNTER — Other Ambulatory Visit (HOSPITAL_COMMUNITY): Payer: Self-pay

## 2024-06-17 ENCOUNTER — Other Ambulatory Visit: Payer: Self-pay

## 2024-06-17 MED ORDER — AZELAIC ACID 15 % EX GEL
CUTANEOUS | 5 refills | Status: AC
  Filled 2024-06-17: qty 50, 30d supply, fill #0
